# Patient Record
Sex: Male | Born: 1985 | Marital: Married | Smoking: Never smoker
Health system: Southern US, Community
[De-identification: ages and names within clinical notes are randomized; demographics above are authoritative.]

## PROBLEM LIST (undated history)

## (undated) DIAGNOSIS — J3081 Allergic rhinitis due to animal (cat) (dog) hair and dander: Secondary | ICD-10-CM

## (undated) DIAGNOSIS — T7840XA Allergy, unspecified, initial encounter: Secondary | ICD-10-CM

## (undated) DIAGNOSIS — J3089 Other allergic rhinitis: Secondary | ICD-10-CM

## (undated) DIAGNOSIS — J45909 Unspecified asthma, uncomplicated: Secondary | ICD-10-CM

## (undated) HISTORY — DX: Unspecified asthma, uncomplicated: J45.909

## (undated) HISTORY — DX: Other allergic rhinitis: J30.89

## (undated) HISTORY — DX: Allergic rhinitis due to animal (cat) (dog) hair and dander: J30.81

## (undated) HISTORY — DX: Allergy, unspecified, initial encounter: T78.40XA

---
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
## 2005-10-20 HISTORY — PX: NASAL SEPTUM SURGERY: SHX37

## 2008-05-22 ENCOUNTER — Ambulatory Visit: Payer: Self-pay | Admitting: Otolaryngology

## 2008-05-25 ENCOUNTER — Ambulatory Visit: Payer: Self-pay | Admitting: Otolaryngology

## 2015-11-29 ENCOUNTER — Ambulatory Visit (INDEPENDENT_AMBULATORY_CARE_PROVIDER_SITE_OTHER): Payer: 59 | Admitting: Family Medicine

## 2015-11-29 ENCOUNTER — Encounter: Payer: Self-pay | Admitting: Family Medicine

## 2015-11-29 VITALS — BP 115/61 | HR 76 | Temp 98.1°F | Resp 17 | Ht 70.0 in | Wt 173.5 lb

## 2015-11-29 DIAGNOSIS — S161XXA Strain of muscle, fascia and tendon at neck level, initial encounter: Secondary | ICD-10-CM | POA: Diagnosis not present

## 2015-11-29 DIAGNOSIS — Z7689 Persons encountering health services in other specified circumstances: Secondary | ICD-10-CM | POA: Insufficient documentation

## 2015-11-29 DIAGNOSIS — Z7189 Other specified counseling: Secondary | ICD-10-CM | POA: Diagnosis not present

## 2015-11-29 MED ORDER — TIZANIDINE HCL 4 MG PO TABS: 4.0000 mg | ORAL_TABLET | Freq: Two times a day (BID) | ORAL | Status: DC | PRN

## 2015-11-29 NOTE — Progress Notes (Signed)
Name: Alexander Mcpherson   MRN: [id number]    DOB: 06-12-1986   Date:11/29/2015       Progress Note  Subjective  Chief Complaint  Chief Complaint  Patient presents with  . Establish Care    NP    HPI  Pt. Is here to establish care. He is doing well.   He has noticed a 'knot' on the right side of neck for 6-8 months, sometimes feels a dull ache at the site of the knot. He also reports getting a headache when the neck muscle is very tight. No pain on rotation, flexion, or extension of the neck. No history of trauma to the affected area   Past Medical History  Diagnosis Date  . Allergy   . Asthma   . Allergic to cats   . Environmental and seasonal allergies     Past Surgical History  Procedure Laterality Date  . Wisdom tooth extraction Bilateral 2004  . Nasal septum surgery  2007    Family History  Problem Relation Age of Onset  . Healthy Mother   . Healthy Father     Social History   Social History  . Marital Status: Single    Spouse Name: N/A  . Number of Children: N/A  . Years of Education: N/A   Occupational History  . Not on file.   Social History Main Topics  . Smoking status: Never Smoker   . Smokeless tobacco: Never Used  . Alcohol Use: No  . Drug Use: No  . Sexual Activity:    Partners: Female   Other Topics Concern  . Not on file   Social History Narrative  . No narrative on file     Current outpatient prescriptions:  .  albuterol (PROVENTIL HFA;VENTOLIN HFA) 108 (90 Base) MCG/ACT inhaler, Inhale 1 puff into the lungs every 6 (six) hours as needed for wheezing or shortness of breath., Disp: , Rfl:  .  montelukast (SINGULAIR) 10 MG tablet, Take 10 mg by mouth at bedtime., Disp: , Rfl:   No Known Allergies   Review of Systems  Constitutional: Negative for fever and chills.  Respiratory: Negative for shortness of breath.   Cardiovascular: Negative for chest pain.  Gastrointestinal: Negative for abdominal pain.  Musculoskeletal:  Positive for neck pain.  Neurological: Positive for headaches.     Objective  Filed Vitals:   11/29/15 1003  BP: 115/61  Pulse: 76  Temp: 98.1 F (36.7 C)  TempSrc: Oral  Resp: 17  Height:  (1.778 m)  Weight: 173 lb 8 oz (78.699 kg)  SpO2: 98%    Physical Exam  Constitutional: He is well-developed, well-nourished, and in no distress.  Cardiovascular: Normal rate and regular rhythm.   Pulmonary/Chest: Effort normal and breath sounds normal.  Musculoskeletal:       Cervical back: He exhibits pain (pt. feel like muscle tightness) and spasm. He exhibits no tenderness.       Back:  Nursing note and vitals reviewed.    Assessment & Plan  1. Encounter to establish care with new doctor   2. Neck muscle strain, initial encounter We'll start on muscle relaxant, advised to apply heat as necessary to the area. Recheck in one month. - tiZANidine (ZANAFLEX) 4 MG tablet; Take 1 tablet (4 mg total) by mouth every 12 (twelve) hours as needed for muscle spasms.  Dispense: 10 tablet; Refill: 0   Eavan Gonterman Asad A[facility] 11/29/2015 10:45 AM

## 2016-01-11 ENCOUNTER — Ambulatory Visit (INDEPENDENT_AMBULATORY_CARE_PROVIDER_SITE_OTHER): Payer: 59 | Admitting: Family Medicine

## 2016-01-11 ENCOUNTER — Encounter: Payer: Self-pay | Admitting: Family Medicine

## 2016-01-11 VITALS — BP 118/68 | HR 68 | Temp 98.1°F | Resp 18 | Ht 70.0 in | Wt 177.1 lb

## 2016-01-11 DIAGNOSIS — Z0000 Encounter for general adult medical examination without abnormal findings: Secondary | ICD-10-CM

## 2016-01-11 NOTE — Progress Notes (Signed)
Name: NHAT HEARNE   MRN: [id number]    DOB: [birth year]   Date:01/11/2016       Progress Note  Subjective  Chief Complaint  Chief Complaint  Patient presents with  . Annual Exam    HPI  Pt. Is here for a Complete Physical Exam.  He is doing well.   Past Medical History  Diagnosis Date  . Allergy   . Asthma   . Allergic to cats   . Environmental and seasonal allergies     Past Surgical History  Procedure Laterality Date  . Wisdom tooth extraction Bilateral 2004  . Nasal septum surgery  2007    Family History  Problem Relation Age of Onset  . Healthy Mother   . Healthy Father     Social History   Social History  . Marital Status: Single    Spouse Name: N/A  . Number of Children: N/A  . Years of Education: N/A   Occupational History  . Not on file.   Social History Main Topics  . Smoking status: Never Smoker   . Smokeless tobacco: Never Used  . Alcohol Use: No  . Drug Use: No  . Sexual Activity:    Partners: Female   Other Topics Concern  . Not on file   Social History Narrative     Current outpatient prescriptions:  .  albuterol (PROVENTIL HFA;VENTOLIN HFA) 108 (90 Base) MCG/ACT inhaler, Inhale 1 puff into the lungs every 6 (six) hours as needed for wheezing or shortness of breath., Disp: , Rfl:  .  montelukast (SINGULAIR) 10 MG tablet, Take 10 mg by mouth at bedtime., Disp: , Rfl:  .  tiZANidine (ZANAFLEX) 4 MG tablet, Take 1 tablet (4 mg total) by mouth every 12 (twelve) hours as needed for muscle spasms., Disp: 10 tablet, Rfl: 0  No Known Allergies   Review of Systems  Constitutional: Positive for malaise/fatigue (feels exhausted some times.). Negative for fever and chills.  HENT: Positive for congestion and sore throat. Negative for ear pain.   Eyes: Negative for blurred vision and double vision.  Respiratory: Negative for cough and wheezing.   Cardiovascular: Negative for chest pain and palpitations.  Gastrointestinal: Negative  for heartburn, nausea, vomiting, abdominal pain, diarrhea, constipation, blood in stool and melena.  Genitourinary: Negative for dysuria, frequency and hematuria.  Musculoskeletal: Negative for myalgias, back pain and joint pain.  Neurological: Positive for headaches (Mostly sinus headaches.). Negative for dizziness.  Endo/Heme/Allergies: Positive for environmental allergies.  Psychiatric/Behavioral: Negative for depression. The patient is not nervous/anxious and does not have insomnia.      Objective  Filed Vitals:   01/11/16 0853  BP: 118/68  Pulse: 68  Temp: 98.1 F (36.7 C)  Resp: 18  Height:  (1.778 m)  Weight: 177 lb 2 oz (80.343 kg)  SpO2: 98%    Physical Exam  Constitutional: He is oriented to person, place, and time and well-developed, well-nourished, and in no distress.  HENT:  Head: Normocephalic and atraumatic.  Right Ear: Tympanic membrane and ear canal normal.  Left Ear: Tympanic membrane and ear canal normal.  Mouth/Throat: Posterior oropharyngeal erythema present.  Nasal mucosal inflammation, turbinate hypertrophy.  Eyes: Conjunctivae are normal. Pupils are equal, round, and reactive to light.  Cardiovascular: Normal rate and regular rhythm.   Pulmonary/Chest: Effort normal and breath sounds normal.  Abdominal: Soft. Bowel sounds are normal.  Genitourinary:  Deferred per patient preference  Musculoskeletal:       Right ankle: He  exhibits no swelling.       Left ankle: He exhibits no swelling.  Neurological: He is alert and oriented to person, place, and time.  Skin: Skin is warm.  Psychiatric: Mood, memory, affect and judgment normal.  Nursing note and vitals reviewed.    Assessment & Plan  1. Annual physical exam Age appropriate laboratory screenings obtained. - CBC with Differential - Lipid Profile - Comprehensive Metabolic Panel (CMET) - Vitamin D (25 hydroxy)   Mallie Linnemann Asad [facility]. [facility] 01/11/2016 9:03 AM

## 2016-01-12 LAB — CBC WITH DIFFERENTIAL/PLATELET
BASOS ABS: 0 10*3/uL (ref 0.0–0.2)
Basos: 1 %
EOS (ABSOLUTE): 0.2 10*3/uL (ref 0.0–0.4)
Eos: 3 %
HEMOGLOBIN: 15.3 g/dL (ref 12.6–17.7)
Hematocrit: 44 % (ref 37.5–51.0)
IMMATURE GRANS (ABS): 0 10*3/uL (ref 0.0–0.1)
Immature Granulocytes: 0 %
LYMPHS: 26 %
Lymphocytes Absolute: 1.6 10*3/uL (ref 0.7–3.1)
MCH: 29.3 pg (ref 26.6–33.0)
MCHC: 34.8 g/dL (ref 31.5–35.7)
MCV: 84 fL (ref 79–97)
MONOCYTES: 10 %
Monocytes Absolute: 0.6 10*3/uL (ref 0.1–0.9)
Neutrophils Absolute: 3.6 10*3/uL (ref 1.4–7.0)
Neutrophils: 60 %
Platelets: 213 10*3/uL (ref 150–379)
RBC: 5.22 x10E6/uL (ref 4.14–5.80)
RDW: 13.3 % (ref 12.3–15.4)
WBC: 6 10*3/uL (ref 3.4–10.8)

## 2016-01-12 LAB — COMPREHENSIVE METABOLIC PANEL
A/G RATIO: 1.7 (ref 1.2–2.2)
ALK PHOS: 71 IU/L (ref 39–117)
ALT: 14 IU/L (ref 0–44)
AST: 18 IU/L (ref 0–40)
Albumin: 4.7 g/dL (ref 3.5–5.5)
BUN / CREAT RATIO: 11 (ref 8–19)
BUN: 12 mg/dL (ref 6–20)
Bilirubin Total: 0.6 mg/dL (ref 0.0–1.2)
CALCIUM: 10 mg/dL (ref 8.7–10.2)
CO2: 26 mmol/L (ref 18–29)
Chloride: 103 mmol/L (ref 96–106)
Creatinine, Ser: 1.13 mg/dL (ref 0.76–1.27)
GFR calc Af Amer: 101 mL/min/{1.73_m2} (ref >59)
GFR, EST NON AFRICAN AMERICAN: 87 mL/min/{1.73_m2} (ref >59)
GLOBULIN, TOTAL: 2.8 g/dL (ref 1.5–4.5)
Glucose: 97 mg/dL (ref 65–99)
POTASSIUM: 5.8 mmol/L — AB (ref 3.5–5.2)
SODIUM: 144 mmol/L (ref 134–144)
Total Protein: 7.5 g/dL (ref 6.0–8.5)

## 2016-01-12 LAB — LIPID PANEL
CHOLESTEROL TOTAL: 130 mg/dL (ref 100–199)
Chol/HDL Ratio: 2.5 ratio units (ref 0.0–5.0)
HDL: 53 mg/dL (ref >39)
LDL CALC: 67 mg/dL (ref 0–99)
TRIGLYCERIDES: 52 mg/dL (ref 0–149)
VLDL Cholesterol Cal: 10 mg/dL (ref 5–40)

## 2016-01-12 LAB — VITAMIN D 25 HYDROXY (VIT D DEFICIENCY, FRACTURES): Vit D, 25-Hydroxy: 19.3 ng/mL — ABNORMAL LOW (ref 30.0–100.0)

## 2016-01-17 ENCOUNTER — Telehealth: Payer: Self-pay

## 2016-01-17 MED ORDER — VITAMIN D (ERGOCALCIFEROL) 1.25 MG (50000 UNIT) PO CAPS: 50000.0000 [IU] | ORAL_CAPSULE | ORAL | Status: DC

## 2016-01-17 NOTE — Telephone Encounter (Signed)
Lab results have been reported to patient and a prescription for Vitamin D3 50,000 units has [facility] per Dr. Sherryll BurgerShah and patient is to take 1 capsule once a week for 12 weeks, patient has been notified

## 2016-09-08 DIAGNOSIS — H5211 Myopia, right eye: Secondary | ICD-10-CM | POA: Diagnosis not present

## 2016-09-08 DIAGNOSIS — H52223 Regular astigmatism, bilateral: Secondary | ICD-10-CM | POA: Diagnosis not present

## 2017-06-09 ENCOUNTER — Encounter: Payer: Self-pay | Admitting: Family Medicine

## 2017-06-09 ENCOUNTER — Ambulatory Visit (INDEPENDENT_AMBULATORY_CARE_PROVIDER_SITE_OTHER): Payer: 59 | Admitting: Family Medicine

## 2017-06-09 VITALS — BP 104/76 | HR 75 | Temp 98.1°F | Resp 16 | Ht 70.0 in | Wt 172.1 lb

## 2017-06-09 DIAGNOSIS — Z0000 Encounter for general adult medical examination without abnormal findings: Secondary | ICD-10-CM

## 2017-06-09 DIAGNOSIS — J341 Cyst and mucocele of nose and nasal sinus: Secondary | ICD-10-CM | POA: Diagnosis not present

## 2017-06-09 DIAGNOSIS — J310 Chronic rhinitis: Secondary | ICD-10-CM | POA: Diagnosis not present

## 2017-06-09 NOTE — Progress Notes (Signed)
Name: Alexander Mcpherson   MRN: [id number]    DOB: October 27, 1985   Date:06/09/2017       Progress Note  Subjective  Chief Complaint  Chief Complaint  Patient presents with  . Annual Exam    HPI  Pt presents for annual examination and physical  - Pt notes that he was at the dentist about 2 years ago and was told he had a cyst in his LEFT maxillary sinus cavity after having Xrays done, would like referral to ENT for further evaluation. He has chronic rhinorrhea with clear drainage.  USPSTF grade A and B recommendations  Diet: Eats a well balanced diet; recently did the keto diet for 30 days. Exercise: Five days a week - usually goes to the gym and works out for about 1.5 hours - cardio and weight.  Also plays basketball in fall and spring.  Depression:  Depression screen [facility] 2/9 06/09/2017 01/11/2016 11/29/2015  Decreased Interest 0 0 0  Down, Depressed, Hopeless 0 0 0  PHQ - 2 Score 0 0 0   Hypertension: BP Readings from Last 3 Encounters:  06/09/17 104/76  01/11/16 118/68  11/29/15 115/61   Obesity: Wt Readings from Last 3 Encounters:  06/09/17 172 lb 1.6 oz (78.1 kg)  01/11/16 177 lb 2 oz (80.3 kg)  11/29/15 173 lb 8 oz (78.7 kg)   BMI Readings from Last 3 Encounters:  06/09/17 24.69 kg/m  01/11/16 25.41 kg/m  11/29/15 24.89 kg/m    Alcohol: None Tobacco use: Never user  HIV, hep B, hep C: Will defer HIV screen until next year; no accidental needle sticks etc. That would put pt at risk for Hep B/C exposure Married STD testing and prevention (chl/gon/syphilis): Declines Lipids:  Lab Results  Component Value Date   CHOL 130 01/11/2016   Lab Results  Component Value Date   HDL 53 01/11/2016   Lab Results  Component Value Date   LDLCALC 67 01/11/2016   Lab Results  Component Value Date   TRIG 52 01/11/2016   Lab Results  Component Value Date   CHOLHDL 2.5 01/11/2016   No results found for: LDLDIRECT Glucose:  Glucose  Date Value Ref Range Status   01/11/2016 97 65 - 99 mg/dL Final   Colorectal cancer: No family history of colorectal cancer; no need to screen early Prostate cancer: No family history of prostate cancer; no need to screen early No results found for: PSA Skin cancer: No concerning lesions that pt has noticed; Doesn't use sunscreen every time. ECG: No chest pain, shortness of breath, or palpitations. Advanced Care Planning: A voluntary discussion about advance care planning including the explanation and discussion of advance directives was extensively discussed with the patient. Explanation about the health care proxy and Living will was reviewed. During this discussion, the patient was able to identify a health care proxy as Jeet Shough.  Patient Active Problem List   Diagnosis Date Noted  . Annual physical exam 01/11/2016  . Encounter to establish care with new doctor 11/29/2015    Past Surgical History:  Procedure Laterality Date  . NASAL SEPTUM SURGERY  2007  . WISDOM TOOTH EXTRACTION Bilateral 2004    Family History  Problem Relation Age of Onset  . Healthy Mother   . Healthy Father     Social History   Social History  . Marital status: Single    Spouse name: N/A  . Number of children: N/A  . Years of education: N/A   Occupational History  .  Not on file.   Social History Main Topics  . Smoking status: Never Smoker  . Smokeless tobacco: Never Used  . Alcohol use No  . Drug use: No  . Sexual activity: Yes    Partners: Female   Other Topics Concern  . Not on file   Social History Narrative  . No narrative on file    Current Outpatient Prescriptions:  .  albuterol (PROVENTIL HFA;VENTOLIN HFA) 108 (90 Base) MCG/ACT inhaler, Inhale 1 puff into the lungs every 6 (six) hours as needed for wheezing or shortness of breath., Disp: , Rfl:  .  montelukast (SINGULAIR) 10 MG tablet, Take 10 mg by mouth at bedtime., Disp: , Rfl:  .  Vitamin D, Ergocalciferol, (DRISDOL) 50000 units CAPS capsule,  Take 1 capsule (50,000 Units total) by mouth once a week. For 12 weeks, Disp: 12 capsule, Rfl: 0  No Known Allergies  ROS  Constitutional: Negative for fever or weight change.  Respiratory: Negative for cough and shortness of breath.   Cardiovascular: Negative for chest pain or palpitations.  Gastrointestinal: Negative for abdominal pain, no bowel changes.  Musculoskeletal: Negative for gait problem or joint swelling.  Skin: Negative for rash.  Neurological: Negative for dizziness; Positive for headaches - describes as coming up from shoulder/neck muscles and into the posterior head; also describes occasional sinus headaches.  No other specific complaints in a complete review of systems (except as listed in HPI above).  Objective  Vitals:   06/09/17 0826  BP: 104/76  Pulse: 75  Resp: 16  Temp: 98.1 F (36.7 C)  TempSrc: Oral  SpO2: 96%  Weight: 172 lb 1.6 oz (78.1 kg)  Height: 5\' 10"  (1.778 m)    Body mass index is 24.69 kg/m.  Physical Exam Constitutional: Patient appears well-developed and well-nourished. No distress.  HENT: Head: Normocephalic and atraumatic. Ears: B TMs ok, no erythema or effusion; Nose: Nose normal. Mouth/Throat: Oropharynx is clear and moist. No oropharyngeal exudate.  Eyes: Conjunctivae and EOM are normal. Pupils are equal, round, and reactive to light. No scleral icterus.  Neck: Normal range of motion. Neck supple. No JVD present. No thyromegaly present.  Cardiovascular: Normal rate, regular rhythm and normal heart sounds.  No murmur heard. No BLE edema. Pulmonary/Chest: Effort normal and breath sounds normal. No respiratory distress. Abdominal: Soft. Bowel sounds are normal, no distension. There is no tenderness. no masses Musculoskeletal: Normal range of motion, no joint effusions. No gross deformities Neurological: he is alert and oriented to person, place, and time. No cranial nerve deficit. Coordination, balance, strength, speech and gait are  normal.  Skin: Skin is warm and dry. No rash noted. No erythema.  Psychiatric: Patient has a normal mood and affect. behavior is normal. Judgment and thought content normal.  No results found for this or any previous visit (from the past 2160 hour(s)).  PHQ2/9: Depression screen [facility] 2/9 06/09/2017 01/11/2016 11/29/2015  Decreased Interest 0 0 0  Down, Depressed, Hopeless 0 0 0  PHQ - 2 Score 0 0 0   Fall Risk: Fall Risk  06/09/2017 01/11/2016 11/29/2015  Falls in the past year? No No No   Assessment & Plan  1. Annual physical exam Discussed importance of 150 minutes of physical activity weekly, eat two servings of fish weekly, eat one serving of tree nuts ( cashews, pistachios, pecans, almonds.Marland Kitchen) every other day, eat 6 servings of fruit/vegetables daily and drink plenty of water and avoid sweet beverages.   2. Chronic rhinitis - Ambulatory referral  to ENT  3. Maxillary sinus cyst - Ambulatory referral to ENT  -USPSTF grade A and B recommendations reviewed with patient; age-appropriate recommendations, preventive care, screening tests, etc discussed and encouraged; healthy living encouraged; see AVS for patient education given to patient

## 2017-06-09 NOTE — Patient Instructions (Addendum)
Preventive Care 18-39 Years, Male Preventive care refers to lifestyle choices and visits with your health care provider that can promote health and wellness. What does preventive care include?  A yearly physical exam. This is also called an annual well check.  Dental exams once or twice a year.  Routine eye exams. Ask your health care provider how often you should have your eyes checked.  Personal lifestyle choices, including: ? Daily care of your teeth and gums. ? Regular physical activity. ? Eating a healthy diet. ? Avoiding tobacco and drug use. ? Limiting alcohol use. ? Practicing safe sex. What happens during an annual well check? The services and screenings done by your health care provider during your annual well check will depend on your age, overall health, lifestyle risk factors, and family history of disease. Counseling Your health care provider may ask you questions about your:  Alcohol use.  Tobacco use.  Drug use.  Emotional well-being.  Home and relationship well-being.  Sexual activity.  Eating habits.  Work and work environment.  Screening You may have the following tests or measurements:  Height, weight, and BMI.  Blood pressure.  Lipid and cholesterol levels. These may be checked every 5 years starting at age 20.  Diabetes screening. This is done by checking your blood sugar (glucose) after you have not eaten for a while (fasting).  Skin check.  Hepatitis C blood test.  Hepatitis B blood test.  Sexually transmitted disease (STD) testing.  Discuss your test results, treatment options, and if necessary, the need for more tests with your health care provider. Vaccines Your health care provider may recommend certain vaccines, such as:  Influenza vaccine. This is recommended every year.  Tetanus, diphtheria, and acellular pertussis (Tdap, Td) vaccine. You may need a Td booster every 10 years.  Varicella vaccine. You may need this if you  have not been vaccinated.  HPV vaccine. If you are 26 or younger, you may need three doses over 6 months.  Measles, mumps, and rubella (MMR) vaccine. You may need at least one dose of MMR.You may also need a second dose.  Pneumococcal 13-valent conjugate (PCV13) vaccine. You may need this if you have certain conditions and have not been vaccinated.  Pneumococcal polysaccharide (PPSV23) vaccine. You may need one or two doses if you smoke cigarettes or if you have certain conditions.  Meningococcal vaccine. One dose is recommended if you are age 19-21 years and a first-year college student living in a residence hall, or if you have one of several medical conditions. You may also need additional booster doses.  Hepatitis A vaccine. You may need this if you have certain conditions or if you travel or work in places where you may be exposed to hepatitis A.  Hepatitis B vaccine. You may need this if you have certain conditions or if you travel or work in places where you may be exposed to hepatitis B.  Haemophilus influenzae type b (Hib) vaccine. You may need this if you have certain risk factors.  Talk to your health care provider about which screenings and vaccines you need and how often you need them. This information is not intended to replace advice given to you by your health care provider. Make sure you discuss any questions you have with your health care provider. Document Released: 12/02/2001 Document Revised: 06/25/2016 Document Reviewed: 08/07/2015 Elsevier Interactive Patient Education  2017 Elsevier Inc.  

## 2017-06-24 DIAGNOSIS — J329 Chronic sinusitis, unspecified: Secondary | ICD-10-CM | POA: Diagnosis not present

## 2017-06-24 DIAGNOSIS — J310 Chronic rhinitis: Secondary | ICD-10-CM | POA: Diagnosis not present

## 2017-09-01 DIAGNOSIS — H52223 Regular astigmatism, bilateral: Secondary | ICD-10-CM | POA: Diagnosis not present

## 2017-09-01 DIAGNOSIS — H5213 Myopia, bilateral: Secondary | ICD-10-CM | POA: Diagnosis not present

## 2017-11-27 ENCOUNTER — Other Ambulatory Visit: Payer: Self-pay | Admitting: Family Medicine

## 2017-11-27 ENCOUNTER — Ambulatory Visit
Admission: RE | Admit: 2017-11-27 | Discharge: 2017-11-27 | Disposition: A | Discharge status: Home / Self Care | Payer: 59 | Source: Ambulatory Visit | Attending: Family Medicine | Admitting: Family Medicine

## 2017-11-27 DIAGNOSIS — T1490XA Injury, unspecified, initial encounter: Secondary | ICD-10-CM

## 2017-11-27 DIAGNOSIS — M25532 Pain in left wrist: Secondary | ICD-10-CM | POA: Insufficient documentation

## 2017-11-27 DIAGNOSIS — S6992XA Unspecified injury of left wrist, hand and finger(s), initial encounter: Secondary | ICD-10-CM | POA: Diagnosis not present

## 2017-11-27 DIAGNOSIS — M79642 Pain in left hand: Secondary | ICD-10-CM | POA: Diagnosis not present

## 2018-06-23 ENCOUNTER — Encounter: Payer: Self-pay | Admitting: Family Medicine

## 2018-07-09 ENCOUNTER — Encounter: Payer: Self-pay | Admitting: Family Medicine

## 2018-07-09 ENCOUNTER — Other Ambulatory Visit (HOSPITAL_COMMUNITY)
Admission: RE | Admit: 2018-07-09 | Discharge: 2018-07-09 | Disposition: A | Discharge status: Home / Self Care | Payer: 59 | Source: Ambulatory Visit | Attending: Family Medicine | Admitting: Family Medicine

## 2018-07-09 ENCOUNTER — Ambulatory Visit (INDEPENDENT_AMBULATORY_CARE_PROVIDER_SITE_OTHER): Payer: 59 | Admitting: Family Medicine

## 2018-07-09 VITALS — BP 110/68 | HR 83 | Temp 98.1°F | Resp 16 | Ht 70.0 in | Wt 166.7 lb

## 2018-07-09 DIAGNOSIS — E559 Vitamin D deficiency, unspecified: Secondary | ICD-10-CM

## 2018-07-09 DIAGNOSIS — J4520 Mild intermittent asthma, uncomplicated: Secondary | ICD-10-CM | POA: Diagnosis not present

## 2018-07-09 DIAGNOSIS — Z13220 Encounter for screening for lipoid disorders: Secondary | ICD-10-CM | POA: Diagnosis not present

## 2018-07-09 DIAGNOSIS — Z833 Family history of diabetes mellitus: Secondary | ICD-10-CM

## 2018-07-09 DIAGNOSIS — Z113 Encounter for screening for infections with a predominantly sexual mode of transmission: Secondary | ICD-10-CM | POA: Diagnosis not present

## 2018-07-09 DIAGNOSIS — Z1159 Encounter for screening for other viral diseases: Secondary | ICD-10-CM

## 2018-07-09 DIAGNOSIS — R5383 Other fatigue: Secondary | ICD-10-CM | POA: Diagnosis not present

## 2018-07-09 DIAGNOSIS — Z0000 Encounter for general adult medical examination without abnormal findings: Secondary | ICD-10-CM

## 2018-07-09 MED ORDER — ALBUTEROL SULFATE 108 (90 BASE) MCG/ACT IN AEPB: 1.0000 | INHALATION_SPRAY | RESPIRATORY_TRACT | 3 refills | Status: DC | PRN

## 2018-07-09 NOTE — Patient Instructions (Signed)
Preventive Care 18-39 Years, Male Preventive care refers to lifestyle choices and visits with your health care provider that can promote health and wellness. What does preventive care include?  A yearly physical exam. This is also called an annual well check.  Dental exams once or twice a year.  Routine eye exams. Ask your health care provider how often you should have your eyes checked.  Personal lifestyle choices, including: ? Daily care of your teeth and gums. ? Regular physical activity. ? Eating a healthy diet. ? Avoiding tobacco and drug use. ? Limiting alcohol use. ? Practicing safe sex. What happens during an annual well check? The services and screenings done by your health care provider during your annual well check will depend on your age, overall health, lifestyle risk factors, and family history of disease. Counseling Your health care provider may ask you questions about your:  Alcohol use.  Tobacco use.  Drug use.  Emotional well-being.  Home and relationship well-being.  Sexual activity.  Eating habits.  Work and work Statistician.  Screening You may have the following tests or measurements:  Height, weight, and BMI.  Blood pressure.  Lipid and cholesterol levels. These may be checked every 5 years starting at age 34.  Diabetes screening. This is done by checking your blood sugar (glucose) after you have not eaten for a while (fasting).  Skin check.  Hepatitis C blood test.  Hepatitis B blood test.  Sexually transmitted disease (STD) testing.  Discuss your test results, treatment options, and if necessary, the need for more tests with your health care provider. Vaccines Your health care provider may recommend certain vaccines, such as:  Influenza vaccine. This is recommended every year.  Tetanus, diphtheria, and acellular pertussis (Tdap, Td) vaccine. You may need a Td booster every 10 years.  Varicella vaccine. You may need this if you  have not been vaccinated.  HPV vaccine. If you are 23 or younger, you may need three doses over 6 months.  Measles, mumps, and rubella (MMR) vaccine. You may need at least one dose of MMR.You may also need a second dose.  Pneumococcal 13-valent conjugate (PCV13) vaccine. You may need this if you have certain conditions and have not been vaccinated.  Pneumococcal polysaccharide (PPSV23) vaccine. You may need one or two doses if you smoke cigarettes or if you have certain conditions.  Meningococcal vaccine. One dose is recommended if you are age 65-21 years and a first-year college student living in a residence hall, or if you have one of several medical conditions. You may also need additional booster doses.  Hepatitis A vaccine. You may need this if you have certain conditions or if you travel or work in places where you may be exposed to hepatitis A.  Hepatitis B vaccine. You may need this if you have certain conditions or if you travel or work in places where you may be exposed to hepatitis B.  Haemophilus influenzae type b (Hib) vaccine. You may need this if you have certain risk factors.  Talk to your health care provider about which screenings and vaccines you need and how often you need them. This information is not intended to replace advice given to you by your health care provider. Make sure you discuss any questions you have with your health care provider. Document Released: 12/02/2001 Document Revised: 06/25/2016 Document Reviewed: 08/07/2015 Elsevier Interactive Patient Education  [employer].

## 2018-07-09 NOTE — Progress Notes (Signed)
Name: Alexander Mcpherson   MRN: [id number]    DOB: 24-Feb-1986   Date:07/09/2018       Progress Note  Subjective  Chief Complaint  Chief Complaint  Patient presents with  . Annual Exam    HPI  Patient presents for annual CPE.  He does need a refill of his albuterol inhaler; he uses this when he gets a cold, and his last one is expired.  Had asthma growing up, now it only bothers him when he is sick.  USPSTF grade A and B recommendations:  Diet: Well balanced - intermittent fasting for breakfast.  Doesn't limit his intake, but tries to eat healthy options.  Eats out about 30% of the time. Exercise: 4 days a week - weights; 3 days a week plays basketball.  Depression:  Depression screen [facility] 2/9 07/09/2018 06/09/2017 01/11/2016 11/29/2015  Decreased Interest 0 0 0 0  Down, Depressed, Hopeless 0 0 0 0  PHQ - 2 Score 0 0 0 0  Altered sleeping 0 - - -  Tired, decreased energy 0 - - -  Change in appetite 0 - - -  Feeling bad or failure about yourself  0 - - -  Trouble concentrating 0 - - -  Suicidal thoughts 0 - - -  PHQ-9 Score 0 - - -  Difficult doing work/chores Not difficult at all - - -    Hypertension:  BP Readings from Last 3 Encounters:  07/09/18 110/68  06/09/17 104/76  01/11/16 118/68    Obesity: Wt Readings from Last 3 Encounters:  07/09/18 166 lb 11.2 oz (75.6 kg)  06/09/17 172 lb 1.6 oz (78.1 kg)  01/11/16 177 lb 2 oz (80.3 kg)   BMI Readings from Last 3 Encounters:  07/09/18 23.92 kg/m  06/09/17 24.69 kg/m  01/11/16 25.41 kg/m    Lipids:  Lab Results  Component Value Date   CHOL 130 01/11/2016   Lab Results  Component Value Date   HDL 53 01/11/2016   Lab Results  Component Value Date   LDLCALC 67 01/11/2016   Lab Results  Component Value Date   TRIG 52 01/11/2016   Lab Results  Component Value Date   CHOLHDL 2.5 01/11/2016   No results found for: LDLDIRECT Glucose:  Glucose  Date Value Ref Range Status  01/11/2016 97 65 - 99 mg/dL  Final      Office Visit from 07/09/2018 in [facility]  AUDIT-C Score  0     Married STD testing and prevention (HIV/chl/gon/syphilis): We will check today Hep C: We will check today  Skin cancer: No concerning lesions; discussed sunscreen use Colorectal cancer: No family history of colorectal cancer - Denies blood in stool, dark and tarry stool, diarrhea/constipation Prostate cancer: Denies family history prostate cancer.  ECG: Denies chest pain or palpitations; not indicated today  Advanced Care Planning: A voluntary discussion about advance care planning including the explanation and discussion of advance directives.  Discussed health care proxy and Living will, and the patient was able to identify a health care proxy as Alexander Mcpherson.  Patient does not have a living will at present time. If patient does have living will, I have requested they bring this to the clinic to be scanned in to their chart.  Patient Active Problem List   Diagnosis Date Noted  . Annual physical exam 01/11/2016  . Encounter to establish care with new doctor 11/29/2015    Past Surgical History:  Procedure Laterality Date  .  NASAL SEPTUM SURGERY  2007  . WISDOM TOOTH EXTRACTION Bilateral 2004    Family History  Problem Relation Age of Onset  . Diabetes type II Mother   . Healthy Father   . Diabetes type II Maternal Grandmother   . Heart attack Maternal Grandmother     Social History   Socioeconomic History  . Marital status: Married    Spouse name: Not on file  . Number of children: Not on file  . Years of education: Not on file  . Highest education level: Not on file  Occupational History  . Occupation: Pharmacist, communityractice Manager    Employer: [employer]    Comment: Works at [employer] in the [employer]  Social Needs  . Financial resource strain: Not hard at all  . Food insecurity:    Worry: Never true    Inability: Never true  . Transportation needs:    Medical: No     Non-medical: No  Tobacco Use  . Smoking status: Never Smoker  . Smokeless tobacco: Never Used  Substance and Sexual Activity  . Alcohol use: No    Alcohol/week: 0.0 standard drinks  . Drug use: No  . Sexual activity: Yes    Partners: Female  Lifestyle  . Physical activity:    Days per week: 7 days    Minutes per session: 60 min  . Stress: Not at all  Relationships  . Social connections:    Talks on phone: Three times a week    Gets together: Twice a week    Attends religious service: More than 4 times per year    Active member of club or organization: Yes    Attends meetings of clubs or organizations: More than 4 times per year    Relationship status: Married  . Intimate partner violence:    Fear of current or ex partner: No    Emotionally abused: No    Physically abused: No    Forced sexual activity: No  Other Topics Concern  . Not on file  Social History Narrative   Married to wife - Alexander HaltMorgan     Current Outpatient Medications:  .  Albuterol Sulfate (PROAIR RESPICLICK) 108 (90 Base) MCG/ACT AEPB, Inhale 1-2 puffs into the lungs every 4 (four) hours as needed (Wheezing/coughing)., Disp: 1 each, Rfl: 3 .  Vitamin D, Ergocalciferol, (DRISDOL) 50000 units CAPS capsule, Take 1 capsule (50,000 Units total) by mouth once a week. For 12 weeks, Disp: 12 capsule, Rfl: 0  No Known Allergies   ROS  Constitutional: Negative for fever or weight change. Does endorse intermittent fatigue.  Respiratory: Positive for cough and some shortness of breath - notes he is getting over a cold  Cardiovascular: Negative for chest pain or palpitations.  Gastrointestinal: Negative for abdominal pain, no bowel changes.  Musculoskeletal: Negative for gait problem or joint swelling.  Skin: Negative for rash.  Neurological: Negative for dizziness or headache.  No other specific complaints in a complete review of systems (except as listed in HPI above).  Objective  Vitals:   07/09/18 0755   BP: 110/68  Pulse: 83  Resp: 16  Temp: 98.1 F (36.7 C)  TempSrc: Oral  SpO2: 99%  Weight: 166 lb 11.2 oz (75.6 kg)  Height: 5\' 10"  (1.778 m)    Body mass index is 23.92 kg/m.  Physical Exam Constitutional: Patient appears well-developed and well-nourished. No distress.  HENT: Head: Normocephalic and atraumatic. Ears: B TMs ok, no erythema or effusion; Nose: Nose normal.  Mouth/Throat: Oropharynx is clear and moist. No oropharyngeal exudate.  Eyes: Conjunctivae and EOM are normal. Pupils are equal, round, and reactive to light. No scleral icterus.  Neck: Normal range of motion. Neck supple. No JVD present. No thyromegaly present.  Cardiovascular: Normal rate, regular rhythm and normal heart sounds.  No murmur heard. No BLE edema. Pulmonary/Chest: Effort normal and breath sounds normal. No respiratory distress. Abdominal: Soft. Bowel sounds are normal, no distension. There is no tenderness. no masses MALE GENITALIA: Deferred RECTAL: Deferred Musculoskeletal: Normal range of motion, no joint effusions. No gross deformities Neurological: he is alert and oriented to person, place, and time. No cranial nerve deficit. Coordination, balance, strength, speech and gait are normal.  Skin: Skin is warm and dry. No rash noted. No erythema.  Psychiatric: Patient has a normal mood and affect. behavior is normal. Judgment and thought content normal.  No results found for this or any previous visit (from the past 2160 hour(s)).   PHQ2/9: Depression screen [facility] 2/9 07/09/2018 06/09/2017 01/11/2016 11/29/2015  Decreased Interest 0 0 0 0  Down, Depressed, Hopeless 0 0 0 0  PHQ - 2 Score 0 0 0 0  Altered sleeping 0 - - -  Tired, decreased energy 0 - - -  Change in appetite 0 - - -  Feeling bad or failure about yourself  0 - - -  Trouble concentrating 0 - - -  Suicidal thoughts 0 - - -  PHQ-9 Score 0 - - -  Difficult doing work/chores Not difficult at all - - -   Fall Risk: Fall Risk  07/09/2018  06/09/2017 01/11/2016 11/29/2015  Falls in the past year? No No No No   Assessment & Plan  1. Annual physical exam -Prostate cancer screening and PSA options (with potential risks and benefits of testing vs not testing) were discussed along with recent recs/guidelines. -USPSTF grade A and B recommendations reviewed with patient; age-appropriate recommendations, preventive care, screening tests, etc discussed and encouraged; healthy living encouraged; see AVS for patient education given to patient -Discussed importance of 150 minutes of physical activity weekly, eat two servings of fish weekly, eat one serving of tree nuts ( cashews, pistachios, pecans, almonds.Marland Kitchen) every other day, eat 6 servings of fruit/vegetables daily and drink plenty of water and avoid sweet beverages.   2. Mild intermittent asthma without complication - Stable - Albuterol Sulfate (PROAIR RESPICLICK) 108 (90 Base) MCG/ACT AEPB; Inhale 1-2 puffs into the lungs every 4 (four) hours as needed (Wheezing/coughing).  Dispense: 1 each; Refill: 3  3. Family history of diabetes mellitus - COMPLETE METABOLIC PANEL WITH GFR  4. Lipid screening - Lipid panel  5. Routine screening for STI (sexually transmitted infection) - HIV Antibody (routine testing w rflx) - RPR - Hepatitis C antibody - Cytology (oral, anal, urethral) ancillary only  6. Need for hepatitis C screening test - Hepatitis C antibody  7. Vitamin D deficiency - Vitamin D (25 hydroxy)  8. Fatigue, unspecified type - COMPLETE METABOLIC PANEL WITH GFR - Vitamin D (25 hydroxy) - CBC w/Diff/Platelet - TSH  Vaccinations are performed through [facility] - we will work on obtaining records to update his HM.

## 2018-07-12 LAB — COMPLETE METABOLIC PANEL WITH GFR
AG Ratio: 1.7 (calc) (ref 1.0–2.5)
ALBUMIN MSPROF: 4.5 g/dL (ref 3.6–5.1)
ALT: 12 U/L (ref 9–46)
AST: 14 U/L (ref 10–40)
Alkaline phosphatase (APISO): 59 U/L (ref 40–115)
BUN: 16 mg/dL (ref 7–25)
CALCIUM: 9.5 mg/dL (ref 8.6–10.3)
CO2: 28 mmol/L (ref 20–32)
CREATININE: 1.05 mg/dL (ref 0.60–1.35)
Chloride: 103 mmol/L (ref 98–110)
GFR, EST AFRICAN AMERICAN: 109 mL/min/{1.73_m2} (ref >=60)
GFR, EST NON AFRICAN AMERICAN: 94 mL/min/{1.73_m2} (ref >=60)
GLUCOSE: 87 mg/dL (ref 65–99)
Globulin: 2.7 g/dL (calc) (ref 1.9–3.7)
Potassium: 4.4 mmol/L (ref 3.5–5.3)
Sodium: 140 mmol/L (ref 135–146)
Total Bilirubin: 0.5 mg/dL (ref 0.2–1.2)
Total Protein: 7.2 g/dL (ref 6.1–8.1)

## 2018-07-12 LAB — CBC WITH DIFFERENTIAL/PLATELET
Basophils Absolute: 61 cells/uL (ref 0–200)
Basophils Relative: 1 %
EOS PCT: 2 %
Eosinophils Absolute: 122 cells/uL (ref 15–500)
HEMATOCRIT: 44.9 % (ref 38.5–50.0)
HEMOGLOBIN: 14.9 g/dL (ref 13.2–17.1)
LYMPHS ABS: 1574 {cells}/uL (ref 850–3900)
MCH: 28.7 pg (ref 27.0–33.0)
MCHC: 33.2 g/dL (ref 32.0–36.0)
MCV: 86.3 fL (ref 80.0–100.0)
MONOS PCT: 6.2 %
MPV: 11 fL (ref 7.5–12.5)
NEUTROS ABS: 3965 {cells}/uL (ref 1500–7800)
Neutrophils Relative %: 65 %
Platelets: 237 10*3/uL (ref 140–400)
RBC: 5.2 10*6/uL (ref 4.20–5.80)
RDW: 12.7 % (ref 11.0–15.0)
Total Lymphocyte: 25.8 %
WBC mixed population: 378 cells/uL (ref 200–950)
WBC: 6.1 10*3/uL (ref 3.8–10.8)

## 2018-07-12 LAB — LIPID PANEL
CHOL/HDL RATIO: 2.5 (calc) (ref <5.0)
CHOLESTEROL: 118 mg/dL (ref <200)
HDL: 47 mg/dL (ref >40)
LDL CHOLESTEROL (CALC): 59 mg/dL
Non-HDL Cholesterol (Calc): 71 mg/dL (calc) (ref <130)
Triglycerides: 42 mg/dL (ref <150)

## 2018-07-12 LAB — TSH: TSH: 1.23 m[IU]/L (ref 0.40–4.50)

## 2018-07-12 LAB — HEPATITIS C ANTIBODY
Hepatitis C Ab: NONREACTIVE
SIGNAL TO CUT-OFF: 0.06 (ref <1.00)

## 2018-07-12 LAB — CYTOLOGY, (ORAL, ANAL, URETHRAL) ANCILLARY ONLY
Chlamydia: NEGATIVE
NEISSERIA GONORRHEA: NEGATIVE

## 2018-07-12 LAB — RPR: RPR Ser Ql: NONREACTIVE

## 2018-07-12 LAB — VITAMIN D 25 HYDROXY (VIT D DEFICIENCY, FRACTURES): VIT D 25 HYDROXY: 50 ng/mL (ref 30–100)

## 2018-07-12 LAB — HIV ANTIBODY (ROUTINE TESTING W REFLEX): HIV 1&2 Ab, 4th Generation: NONREACTIVE

## 2018-08-26 DIAGNOSIS — H5213 Myopia, bilateral: Secondary | ICD-10-CM | POA: Diagnosis not present

## 2018-08-26 DIAGNOSIS — H52223 Regular astigmatism, bilateral: Secondary | ICD-10-CM | POA: Diagnosis not present

## 2019-04-15 IMAGING — CR DG HAND COMPLETE 3+V*L*
1 series · 3 of 3 positions shown · non-contrast
Comparison: None.

CLINICAL DATA: Pain following fall

EXAM:
LEFT HAND - COMPLETE 3+ VIEW

[Series 1: dg hand complete left · 0.14mm/px · 3 of 3 slices shown]
[im 1/3]
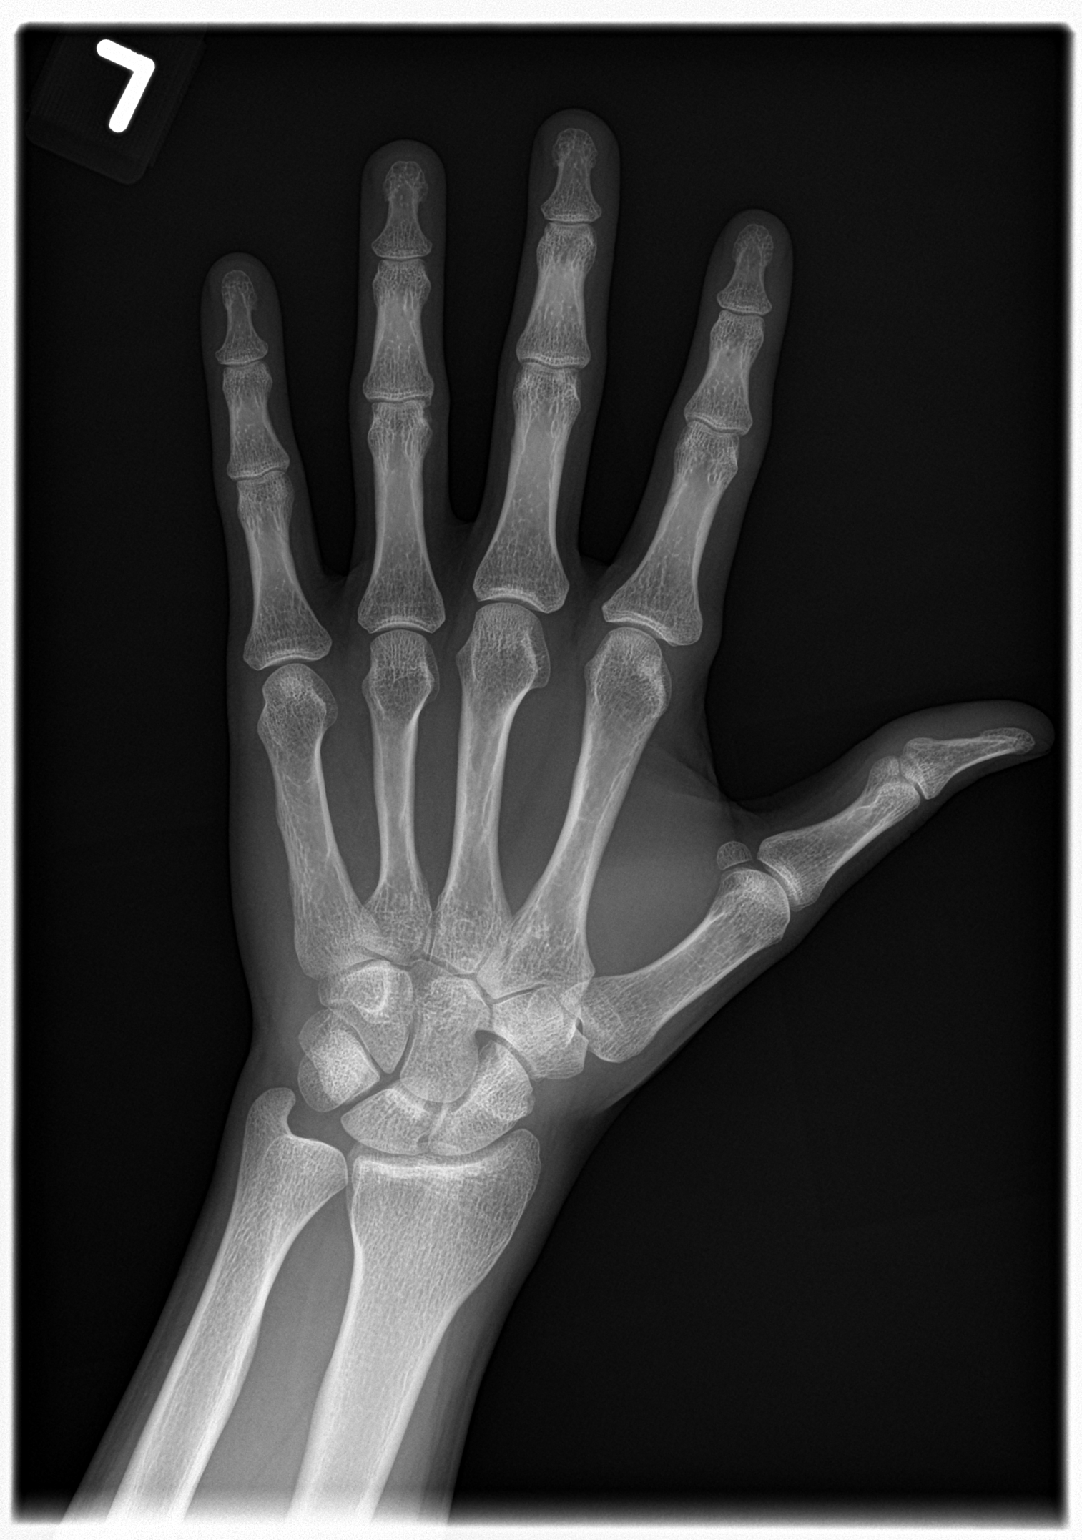
[im 2/3]
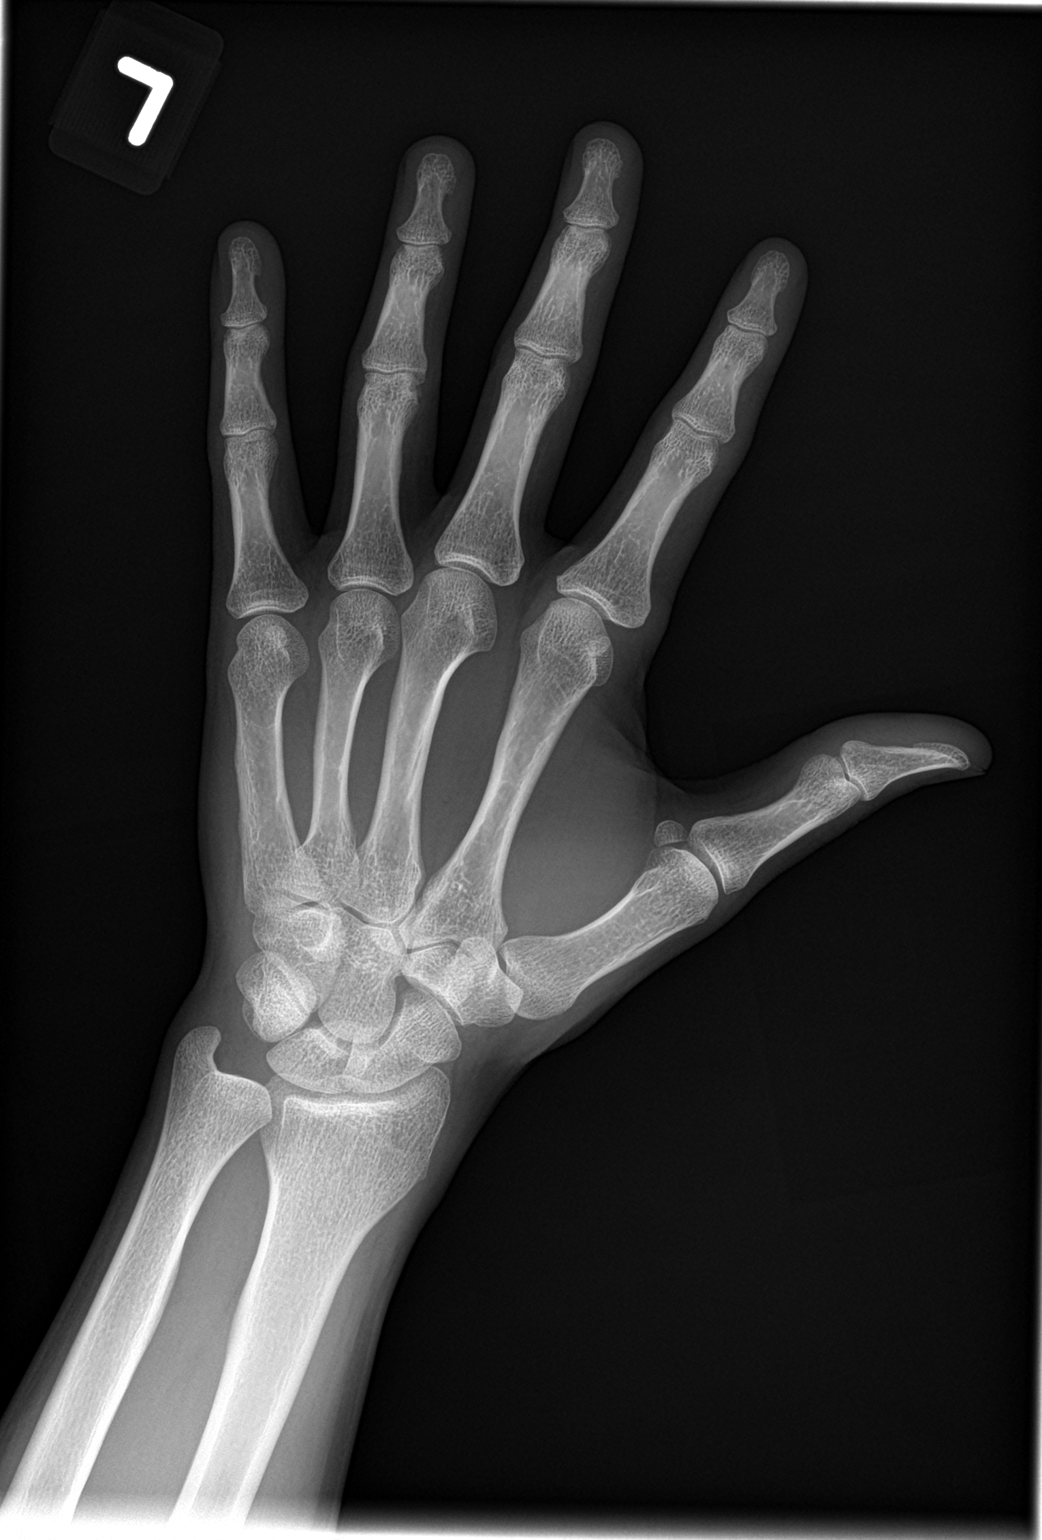
[im 3/3]
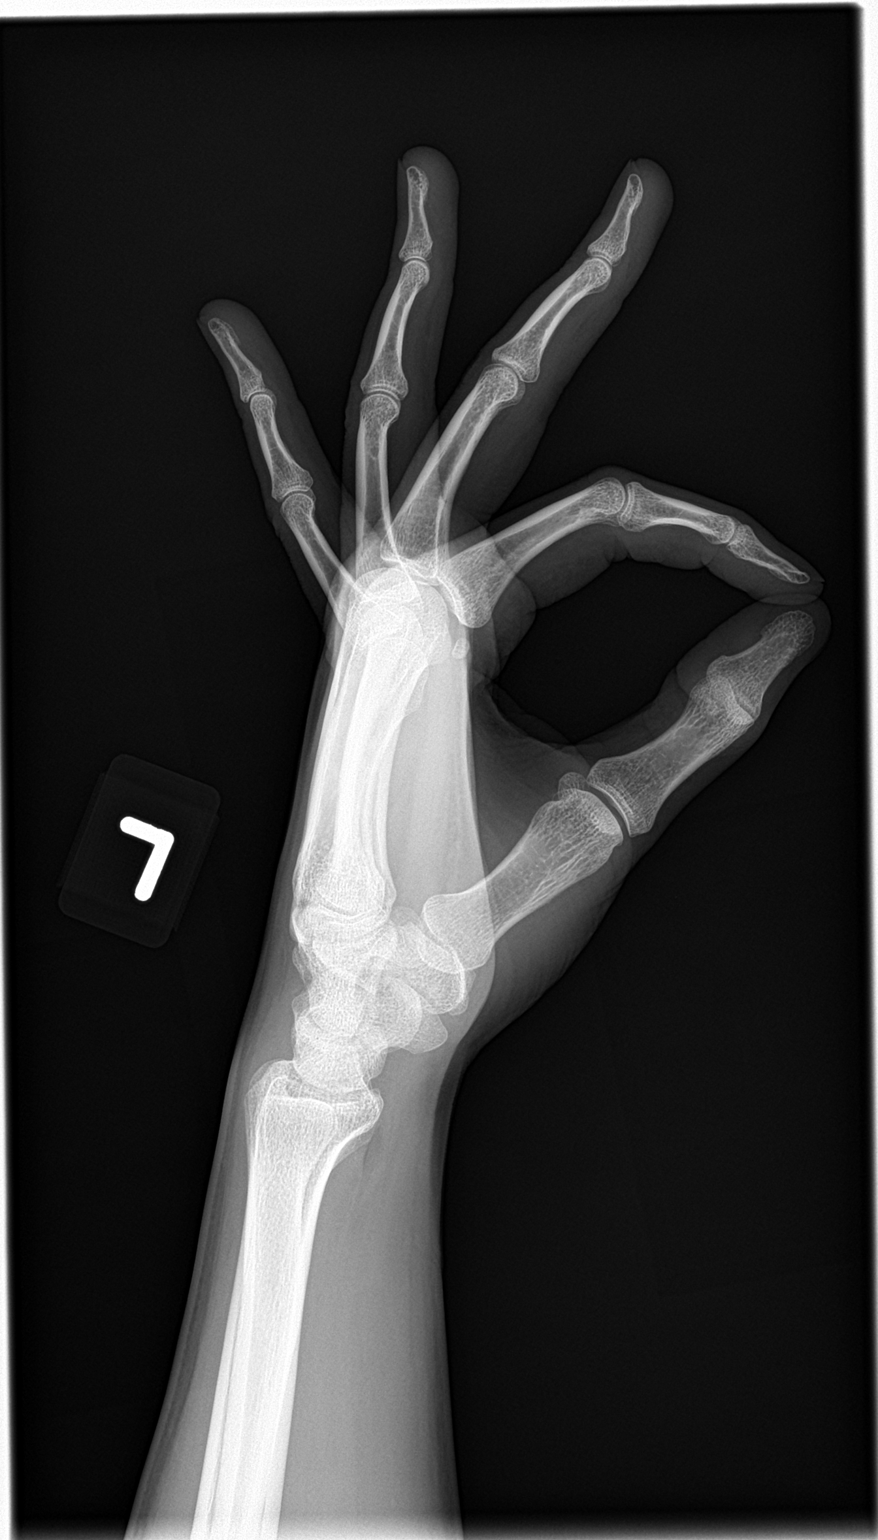

[3 of 3 positions shown; findings below may reference images not displayed]

FINDINGS: Frontal, oblique, and lateral views were obtained. There is no
evident fracture or dislocation. Joint spaces appear normal. No
erosive change.
IMPRESSION: No fracture or dislocation.  No evident arthropathy.

## 2019-04-15 IMAGING — CR DG WRIST COMPLETE 3+V*L*
1 series · 4 of 4 positions shown · non-contrast
Comparison: None.

CLINICAL DATA: Pain following fall

EXAM:
LEFT WRIST - COMPLETE 3+ VIEW

[Series 1: dg wrist complete left · 0.14mm/px · 4 of 4 slices shown]
[im 1/4]
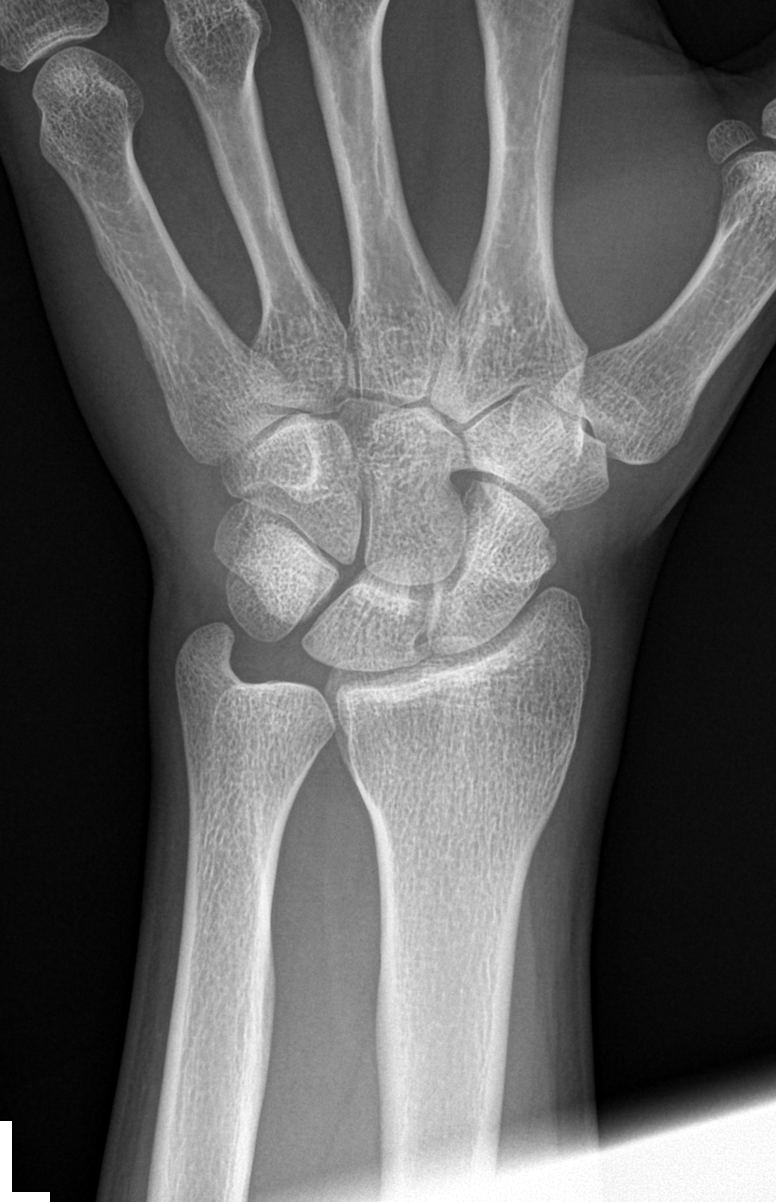
[im 2/4]
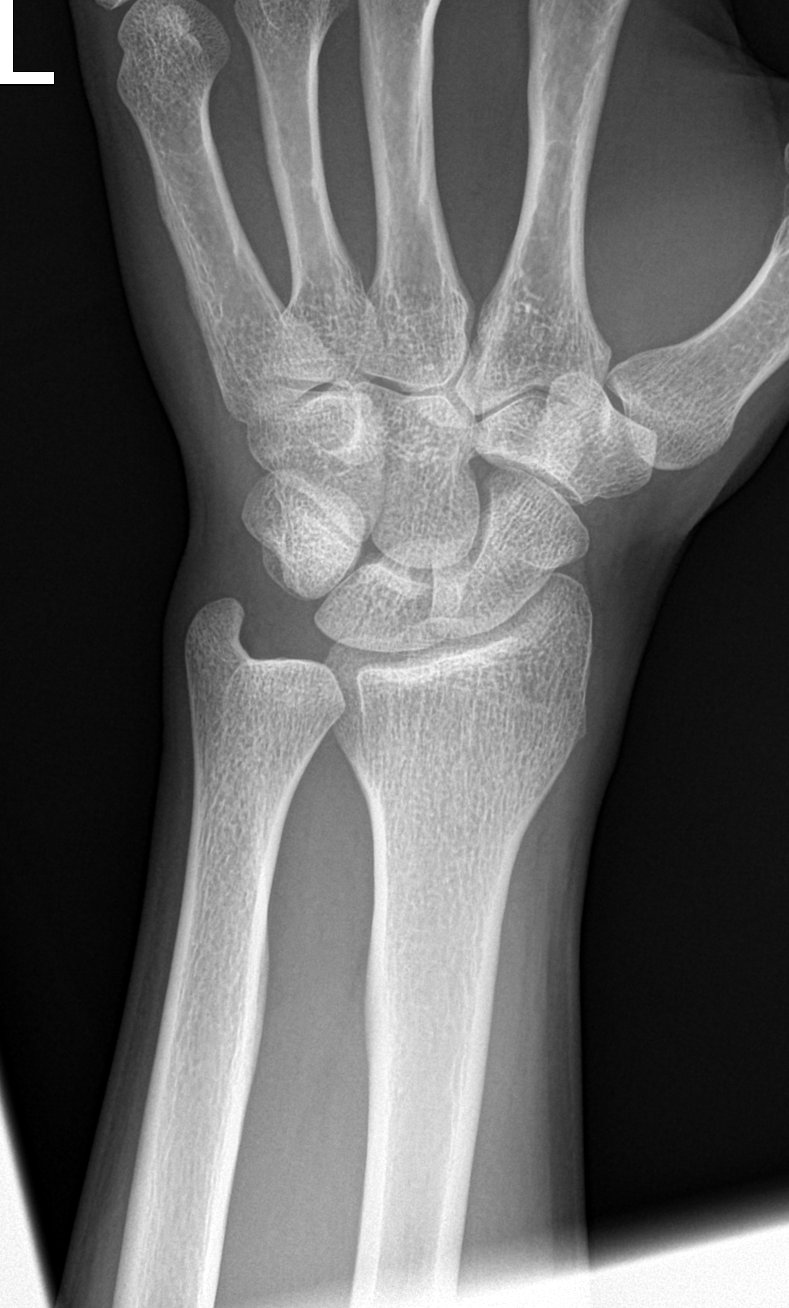
[im 3/4]
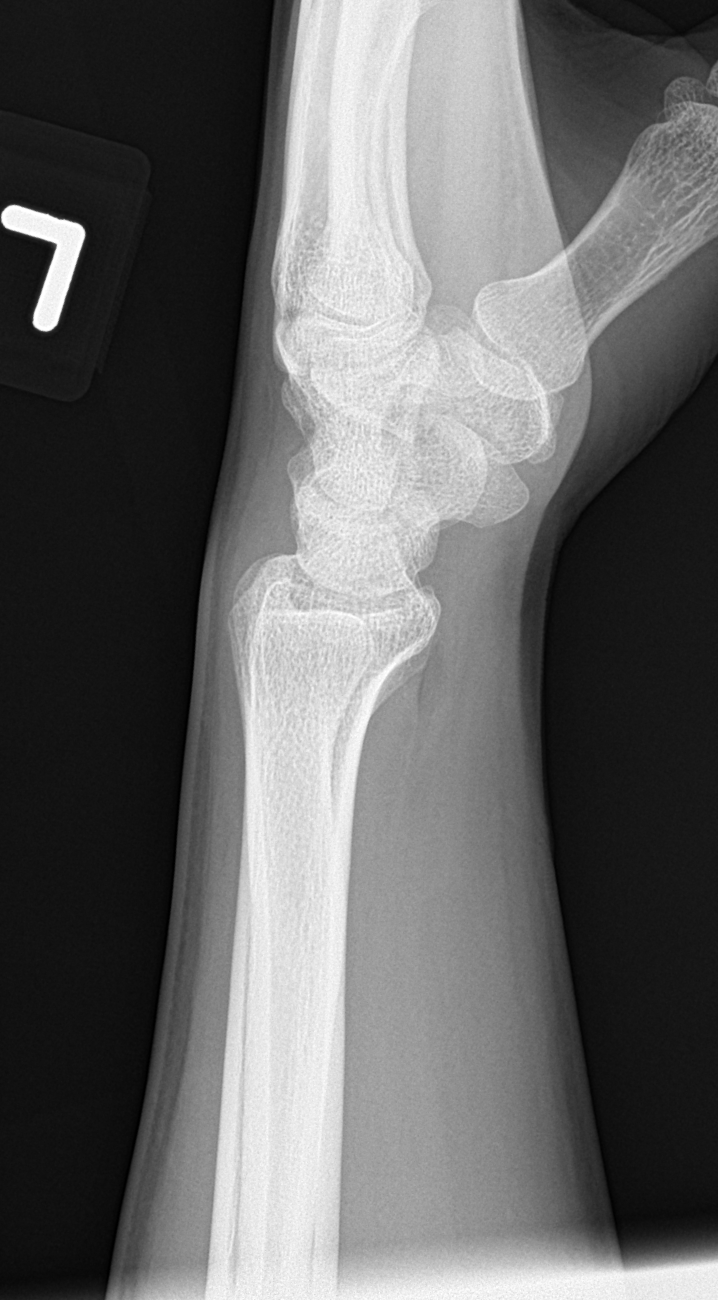
[im 4/4]
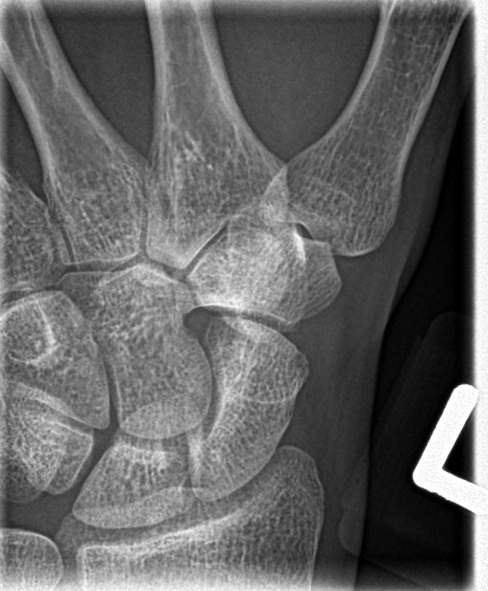

[4 of 4 positions shown; findings below may reference images not displayed]

FINDINGS: Frontal, oblique, lateral, and ulnar deviation scaphoid images were
obtained. There is no fracture or dislocation. Joint spaces appear
normal. No erosive change.
IMPRESSION: No fracture or dislocation.  No evident arthropathy.

## 2019-08-16 ENCOUNTER — Other Ambulatory Visit: Payer: Self-pay | Admitting: Family Medicine

## 2019-08-16 ENCOUNTER — Other Ambulatory Visit: Payer: Self-pay

## 2019-08-16 ENCOUNTER — Ambulatory Visit (INDEPENDENT_AMBULATORY_CARE_PROVIDER_SITE_OTHER): Payer: 59 | Admitting: Family Medicine

## 2019-08-16 ENCOUNTER — Encounter: Payer: Self-pay | Admitting: Family Medicine

## 2019-08-16 VITALS — BP 112/72 | HR 61 | Temp 97.8°F | Resp 16 | Ht 70.0 in | Wt 163.2 lb

## 2019-08-16 DIAGNOSIS — Z0000 Encounter for general adult medical examination without abnormal findings: Secondary | ICD-10-CM

## 2019-08-16 DIAGNOSIS — Z13220 Encounter for screening for lipoid disorders: Secondary | ICD-10-CM

## 2019-08-16 DIAGNOSIS — R591 Generalized enlarged lymph nodes: Secondary | ICD-10-CM

## 2019-08-16 DIAGNOSIS — Z131 Encounter for screening for diabetes mellitus: Secondary | ICD-10-CM

## 2019-08-16 MED ORDER — AMOXICILLIN-POT CLAVULANATE 875-125 MG PO TABS: 1.0000 | ORAL_TABLET | Freq: Two times a day (BID) | ORAL | 0 refills | Status: AC

## 2019-08-16 NOTE — Patient Instructions (Signed)
Preventive Care 19-33 Years Old, Male Preventive care refers to lifestyle choices and visits with your health care provider that can promote health and wellness. This includes:  A yearly physical exam. This is also called an annual well check.  Regular dental and eye exams.  Immunizations.  Screening for certain conditions.  Healthy lifestyle choices, such as eating a healthy diet, getting regular exercise, not using drugs or products that contain nicotine and tobacco, and limiting alcohol use. What can I expect for my preventive care visit? Physical exam Your health care provider will check:  Height and weight. These may be used to calculate body mass index (BMI), which is a measurement that tells if you are at a healthy weight.  Heart rate and blood pressure.  Your skin for abnormal spots. Counseling Your health care provider may ask you questions about:  Alcohol, tobacco, and drug use.  Emotional well-being.  Home and relationship well-being.  Sexual activity.  Eating habits.  Work and work Statistician. What immunizations do I need?  Influenza (flu) vaccine  This is recommended every year. Tetanus, diphtheria, and pertussis (Tdap) vaccine  You may need a Td booster every 10 years. Varicella (chickenpox) vaccine  You may need this vaccine if you have not already been vaccinated. Human papillomavirus (HPV) vaccine  If recommended by your health care provider, you may need three doses over 6 months. Measles, mumps, and rubella (MMR) vaccine  You may need at least one dose of MMR. You may also need a second dose. Meningococcal conjugate (MenACWY) vaccine  One dose is recommended if you are 45-76 years old and a Market researcher living in a residence hall, or if you have one of several medical conditions. You may also need additional booster doses. Pneumococcal conjugate (PCV13) vaccine  You may need this if you have certain conditions and were not  previously vaccinated. Pneumococcal polysaccharide (PPSV23) vaccine  You may need one or two doses if you smoke cigarettes or if you have certain conditions. Hepatitis A vaccine  You may need this if you have certain conditions or if you travel or work in places where you may be exposed to hepatitis A. Hepatitis B vaccine  You may need this if you have certain conditions or if you travel or work in places where you may be exposed to hepatitis B. Haemophilus influenzae type b (Hib) vaccine  You may need this if you have certain risk factors. You may receive vaccines as individual doses or as more than one vaccine together in one shot (combination vaccines). Talk with your health care provider about the risks and benefits of combination vaccines. What tests do I need? Blood tests  Lipid and cholesterol levels. These may be checked every 5 years starting at age 17.  Hepatitis C test.  Hepatitis B test. Screening   Diabetes screening. This is done by checking your blood sugar (glucose) after you have not eaten for a while (fasting).  Sexually transmitted disease (STD) testing. Talk with your health care provider about your test results, treatment options, and if necessary, the need for more tests. Follow these instructions at home: Eating and drinking   Eat a diet that includes fresh fruits and vegetables, whole grains, lean protein, and low-fat dairy products.  Take vitamin and mineral supplements as recommended by your health care provider.  Do not drink alcohol if your health care provider tells you not to drink.  If you drink alcohol: ? Limit how much you have to 0-2  drinks a day. ? Be aware of how much alcohol is in your drink. In the [place] one drink equals one 12 oz bottle of beer (355 mL), one 5 oz glass of wine (148 mL), or one 1 oz glass of hard liquor (44 mL). Lifestyle  Take daily care of your teeth and gums.  Stay active. Exercise for at least 30 minutes on 5 or  more days each week.  Do not use any products that contain nicotine or tobacco, such as cigarettes, e-cigarettes, and chewing tobacco. If you need help quitting, ask your health care provider.  If you are sexually active, practice safe sex. Use a condom or other form of protection to prevent STIs (sexually transmitted infections). What's next?  Go to your health care provider once a year for a well check visit.  Ask your health care provider how often you should have your eyes and teeth checked.  Stay up to date on all vaccines. This information is not intended to replace advice given to you by your health care provider. Make sure you discuss any questions you have with your health care provider. Document Released: 12/02/2001 Document Revised: 09/30/2018 Document Reviewed: 09/30/2018 Elsevier Patient Education  2020 [employer]  

## 2019-08-16 NOTE — Progress Notes (Signed)
Name: ABDULKARIM Mcpherson   MRN: [id number]    DOB: [birth year]   Date:08/16/2019       Progress Note  Subjective  Chief Complaint  Chief Complaint  Patient presents with  . Annual Exam    HPI  Patient presents for annual CPE.   USPSTF grade A and B recommendations:  Diet: He has a fairly good diet with use of plant based diet, he limits processed foods, soft drinks and eats pretty clean. Occasionally eats sweets.  Exercise: Tries to exercise at least 3-4 days a week by riding his bike for about an hour and then will try to walk for an hour.   Depression: phq 9 is negative Depression screen [facility] 2/9 08/16/2019 07/09/2018 06/09/2017 01/11/2016 11/29/2015  Decreased Interest 0 0 0 0 0  Down, Depressed, Hopeless 0 0 0 0 0  PHQ - 2 Score 0 0 0 0 0  Altered sleeping 0 0 - - -  Tired, decreased energy 0 0 - - -  Change in appetite 0 0 - - -  Feeling bad or failure about yourself  0 0 - - -  Trouble concentrating 0 0 - - -  Moving slowly or fidgety/restless 0 - - - -  Suicidal thoughts 0 0 - - -  PHQ-9 Score 0 0 - - -  Difficult doing work/chores Not difficult at all Not difficult at all - - -    Hypertension:  BP Readings from Last 3 Encounters:  08/16/19 112/72  07/09/18 110/68  06/09/17 104/76    Obesity: Wt Readings from Last 3 Encounters:  08/16/19 74 kg  07/09/18 75.6 kg  06/09/17 78.1 kg   BMI Readings from Last 3 Encounters:  08/16/19 23.42 kg/m  07/09/18 23.92 kg/m  06/09/17 24.69 kg/m     Lipids:  Lab Results  Component Value Date   CHOL 118 07/09/2018   CHOL 130 01/11/2016   Lab Results  Component Value Date   HDL 47 07/09/2018   HDL 53 01/11/2016   Lab Results  Component Value Date   LDLCALC 59 07/09/2018   LDLCALC 67 01/11/2016   Lab Results  Component Value Date   TRIG 42 07/09/2018   TRIG 52 01/11/2016   Lab Results  Component Value Date   CHOLHDL 2.5 07/09/2018   CHOLHDL 2.5 01/11/2016   No results found for:  LDLDIRECT Glucose:  Glucose  Date Value Ref Range Status  01/11/2016 97 65 - 99 mg/dL Final   Glucose, Bld  Date Value Ref Range Status  07/09/2018 87 65 - 99 mg/dL Final    Comment:    .            Fasting reference interval .       Office Visit from 08/16/2019 in [facility]  AUDIT-C Score  0      Married to [place] for 4.5 years with 1 child who is 8 mnths.  STD testing and prevention (HIV/chl/gon/syphilis): Previously screened in 2019 Hep C:  Previously screened in 2019  Skin cancer: of no concern, uses sun screen  Colorectal cancer: no familial history or concerns  Prostate cancer:no familial history or concerns/changes in urination No results found for: PSA  Lung cancer:  Low Dose CT Chest recommended if Age 51-80 years, 30 pack-year currently smoking OR have quit w/in 15years. Patient does not qualify.   AAA: The USPSTF recommends one-time screening with ultrasonography in men ages 7 to 53 years who have ever smoked ECG:  No concerns.   Advanced Care Planning: A voluntary discussion about advance care planning including the explanation and discussion of advance directives.  Discussed health care proxy and Living will, and the patient was able to identify a health care proxy as Alexander Mcpherson wife.  Patient does have a living will at present time. If patient does have living will, I have requested they bring this to the clinic to be scanned in to their chart.  Patient Active Problem List   Diagnosis Date Noted  . Annual physical exam 01/11/2016  . Encounter to establish care with new doctor 11/29/2015    Past Surgical History:  Procedure Laterality Date  . NASAL SEPTUM SURGERY  2007  . WISDOM TOOTH EXTRACTION Bilateral 2004    Family History  Problem Relation Age of Onset  . Diabetes type II Mother   . Healthy Father   . Diabetes type II Maternal Grandmother   . Heart attack Maternal Grandmother [id number]    Social History   Socioeconomic  History  . Marital status: Married    Spouse name: Alexander Mcpherson  . Number of children: 1  . Years of education: Not on file  . Highest education level: Not on file  Occupational History  . Occupation: Pharmacist, communityractice Manager    Employer:     Comment: Works at [employer] in the [employer]  Social Needs  . Financial resource strain: Not hard at all  . Food insecurity    Worry: Never true    Inability: Never true  . Transportation needs    Medical: No    Non-medical: No  Tobacco Use  . Smoking status: Never Smoker  . Smokeless tobacco: Never Used  Substance and Sexual Activity  . Alcohol use: No    Alcohol/week: 0.0 standard drinks  . Drug use: No  . Sexual activity: Yes    Partners: Female  Lifestyle  . Physical activity    Days per week: 4 days    Minutes per session: 60 min  . Stress: Not at all  Relationships  . Social Musicianconnections    Talks on phone: Three times a week    Gets together: Twice a week    Attends religious service: More than 4 times per year    Active member of club or organization: Yes    Attends meetings of clubs or organizations: More than 4 times per year    Relationship status: Married  . Intimate partner violence    Fear of current or ex partner: No    Emotionally abused: No    Physically abused: No    Forced sexual activity: No  Other Topics Concern  . 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  Albuterol Sulfate (PROAIR RESPICLICK) 108 (90 Base) MCG/ACT AEPB, Inhale 1-2 puffs into the lungs every 4 (four) hours as needed (Wheezing/coughing)., Disp: 1 each, Rfl: 3 .  cholecalciferol (VITAMIN D3) 25 MCG (1000 UT) tablet, Take 1,000 Units by mouth daily., Disp: , Rfl:  .  Vitamin D, Ergocalciferol, (DRISDOL) 50000 units CAPS capsule, Take 1 capsule (50,000 Units total) by mouth once a week. For 12 weeks (Patient not taking: Reported on 08/16/2019), Disp: 12 capsule,  Rfl: 0  No Known Allergies   Review of Systems  Constitutional: Negative.   HENT: Negative.   Eyes: Negative.   Respiratory: Negative.   Cardiovascular: Negative.   Gastrointestinal: Negative.  Genitourinary: Negative.   Musculoskeletal: Negative.   Skin: Negative.   Neurological: Negative.   Endo/Heme/Allergies: Positive for environmental allergies.  Psychiatric/Behavioral: Negative.   All other systems reviewed and are negative.      Objective  Vitals:   08/16/19 0847  BP: 112/72  Pulse: 61  Resp: 16  Temp: 97.8 F (36.6 C)  TempSrc: Temporal  SpO2: 99%  Weight: 74 kg  Height: 5\' 10"  (1.778 m)    Body mass index is 23.42 kg/m.  Physical Exam Vitals signs reviewed.  Constitutional:      Appearance: Normal appearance.  HENT:     Head: Normocephalic and atraumatic.     Nose: Nose normal.     Mouth/Throat:     Mouth: Mucous membranes are moist.     Pharynx: Oropharynx is clear.  Eyes:     Pupils: Pupils are equal, round, and reactive to light.  Neck:     Musculoskeletal: Normal range of motion and neck supple.  Cardiovascular:     Rate and Rhythm: Normal rate and regular rhythm.     Pulses: Normal pulses.     Heart sounds: Normal heart sounds.  Pulmonary:     Effort: Pulmonary effort is normal.     Breath sounds: Normal breath sounds.  Abdominal:     General: Abdomen is flat. Bowel sounds are normal.     Palpations: Abdomen is soft.  Musculoskeletal: Normal range of motion.  Lymphadenopathy:     Cervical: Cervical adenopathy (There is a discrete, round, firm, easily moveable, non-tender lesion to the left proximal cervical lymph chain) present.  Skin:    General: Skin is warm and dry.  Neurological:     General: No focal deficit present.     Mental Status: He is alert.  Psychiatric:        Mood and Affect: Mood normal.        Thought Content: Thought content normal.        Judgment: Judgment normal.      No results found for this or any  previous visit (from the past 2160 hour(s)).   PHQ2/9: Depression screen [facility] 2/9 08/16/2019 07/09/2018 06/09/2017 01/11/2016 11/29/2015  Decreased Interest 0 0 0 0 0  Down, Depressed, Hopeless 0 0 0 0 0  PHQ - 2 Score 0 0 0 0 0  Altered sleeping 0 0 - - -  Tired, decreased energy 0 0 - - -  Change in appetite 0 0 - - -  Feeling bad or failure about yourself  0 0 - - -  Trouble concentrating 0 0 - - -  Moving slowly or fidgety/restless 0 - - - -  Suicidal thoughts 0 0 - - -  PHQ-9 Score 0 0 - - -  Difficult doing work/chores Not difficult at all Not difficult at all - - -     Fall Risk: Fall Risk  08/16/2019 07/09/2018 06/09/2017 01/11/2016 11/29/2015  Falls in the past year? 0 No No No No  Number falls in past yr: 0 - - - -  Injury with Fall? 0 - - - -  Follow up Falls evaluation completed - - - -     Assessment & Plan  1. Annual physical exam -Prostate cancer screening and PSA options (with potential risks and benefits of testing vs not testing) were discussed along with recent recs/guidelines. -USPSTF grade A and B recommendations reviewed with patient; age-appropriate recommendations, preventive care, screening tests, etc discussed and encouraged; healthy living encouraged; see  AVS for patient education given to patient -Discussed importance of 150 minutes of physical activity weekly, eat two servings of fish weekly, eat one serving of tree nuts ( cashews, pistachios, pecans, almonds.Marland Kitchen) every other day, eat 6 servings of fruit/vegetables daily and drink plenty of water and avoid sweet beverages.  - Lipid Profile  2. Lymphadenopathy - CBC w/Diff/Platelet - amoxicillin-clavulanate (AUGMENTIN) 875-125 MG tablet; Take 1 tablet by mouth 2 (two) times daily for 10 days.  Dispense: 20 tablet; Refill: 0 -He will monitor the clavicular lympnode and follow up after antibiotic is completed if not resolved and we will refer to general surgery.  3. Diabetes mellitus screening - Comprehensive  metabolic panel  4. Lipid screening - Lipid Profile

## 2019-08-17 LAB — COMPREHENSIVE METABOLIC PANEL
ALT: 11 IU/L (ref 0–44)
AST: 14 IU/L (ref 0–40)
Albumin/Globulin Ratio: 2 (ref 1.2–2.2)
Albumin: 4.9 g/dL (ref 4.0–5.0)
Alkaline Phosphatase: 54 IU/L (ref 39–117)
BUN/Creatinine Ratio: 12 (ref 9–20)
BUN: 14 mg/dL (ref 6–20)
Bilirubin Total: 0.6 mg/dL (ref 0.0–1.2)
CO2: 24 mmol/L (ref 20–29)
Calcium: 9.8 mg/dL (ref 8.7–10.2)
Chloride: 104 mmol/L (ref 96–106)
Creatinine, Ser: 1.14 mg/dL (ref 0.76–1.27)
GFR calc Af Amer: 97 mL/min/{1.73_m2} (ref >59)
GFR calc non Af Amer: 84 mL/min/{1.73_m2} (ref >59)
Globulin, Total: 2.4 g/dL (ref 1.5–4.5)
Glucose: 92 mg/dL (ref 65–99)
Potassium: 4.8 mmol/L (ref 3.5–5.2)
Sodium: 142 mmol/L (ref 134–144)
Total Protein: 7.3 g/dL (ref 6.0–8.5)

## 2019-08-17 LAB — CBC WITH DIFFERENTIAL/PLATELET
Basophils Absolute: 0 10*3/uL (ref 0.0–0.2)
Basos: 1 %
EOS (ABSOLUTE): 0.1 10*3/uL (ref 0.0–0.4)
Eos: 2 %
Hematocrit: 45.3 % (ref 37.5–51.0)
Hemoglobin: 15.2 g/dL (ref 13.0–17.7)
Immature Grans (Abs): 0 10*3/uL (ref 0.0–0.1)
Immature Granulocytes: 0 %
Lymphocytes Absolute: 1.5 10*3/uL (ref 0.7–3.1)
Lymphs: 32 %
MCH: 28.7 pg (ref 26.6–33.0)
MCHC: 33.6 g/dL (ref 31.5–35.7)
MCV: 86 fL (ref 79–97)
Monocytes Absolute: 0.4 10*3/uL (ref 0.1–0.9)
Monocytes: 8 %
Neutrophils Absolute: 2.7 10*3/uL (ref 1.4–7.0)
Neutrophils: 57 %
Platelets: 180 10*3/uL (ref 150–450)
RBC: 5.3 x10E6/uL (ref 4.14–5.80)
RDW: 13 % (ref 11.6–15.4)
WBC: 4.7 10*3/uL (ref 3.4–10.8)

## 2019-08-17 LAB — LIPID PANEL
Chol/HDL Ratio: 2.5 ratio (ref 0.0–5.0)
Cholesterol, Total: 137 mg/dL (ref 100–199)
HDL: 55 mg/dL (ref >39)
LDL Chol Calc (NIH): 72 mg/dL (ref 0–99)
Triglycerides: 45 mg/dL (ref 0–149)
VLDL Cholesterol Cal: 10 mg/dL (ref 5–40)

## 2019-09-26 DIAGNOSIS — H5213 Myopia, bilateral: Secondary | ICD-10-CM | POA: Diagnosis not present

## 2019-09-26 DIAGNOSIS — H52223 Regular astigmatism, bilateral: Secondary | ICD-10-CM | POA: Diagnosis not present

## 2021-10-15 ENCOUNTER — Other Ambulatory Visit: Payer: Self-pay | Admitting: Family

## 2021-10-15 ENCOUNTER — Other Ambulatory Visit: Payer: Self-pay

## 2021-10-15 ENCOUNTER — Ambulatory Visit (INDEPENDENT_AMBULATORY_CARE_PROVIDER_SITE_OTHER): Payer: BC Managed Care – PPO | Admitting: Family

## 2021-10-15 ENCOUNTER — Encounter: Payer: Self-pay | Admitting: Family

## 2021-10-15 VITALS — BP 132/77 | HR 56 | Temp 98.0°F | Ht 70.0 in | Wt 163.8 lb

## 2021-10-15 DIAGNOSIS — Z0000 Encounter for general adult medical examination without abnormal findings: Secondary | ICD-10-CM

## 2021-10-15 DIAGNOSIS — E559 Vitamin D deficiency, unspecified: Secondary | ICD-10-CM | POA: Insufficient documentation

## 2021-10-15 DIAGNOSIS — R5383 Other fatigue: Secondary | ICD-10-CM | POA: Insufficient documentation

## 2021-10-15 NOTE — Patient Instructions (Signed)
Welcome to [facility]! It was a pleasure meeting you today.    PLEASE NOTE:  If you had any LAB tests please let us know if you have not heard back within a few days. You may see your results on MyChart before we have a chance to review them but we will give you a call once they are reviewed by us. If we ordered any REFERRALS today, please let us know if you have not heard from their office within the next week.  Let us know through MyChart if you are needing REFILLS, or have your pharmacy send us the request. You can also use MyChart to communicate with me or any office staff.  Please try these tips to maintain a healthy lifestyle:  Eat most of your calories during the day when you are active. Eliminate processed foods including packaged sweets (pies, cakes, cookies), reduce intake of potatoes, white bread, white pasta, and white rice. Look for whole grain options, oat flour or almond flour.  Each meal should contain half fruits/vegetables, one quarter protein, and one quarter carbs (no bigger than a computer mouse).  Cut down on sweet beverages. This includes juice, soda, and sweet tea. Also watch fruit intake, though this is a healthier sweet option, it still contains natural sugar! Limit to 3 servings daily.  Drink at least 1 glass of water with each meal and aim for at least 8 glasses per day  Exercise at least 150 minutes every week.   

## 2021-10-15 NOTE — Assessment & Plan Note (Signed)
hx of low Vitamin D. also requesting testosterone level. denies any other sx r/t low testosterone.

## 2021-10-15 NOTE — Progress Notes (Signed)
Phone: [phone]   Subjective:  Patient 35 y.o. male presenting for annual physical.  Chief Complaint  Patient presents with   Annual Exam    W/labs   Fatigue    Hx of Vit D Deficiency     See problem oriented charting- ROS- full  review of systems was completed and negative  except for: Vitamin D deficiency  The following were reviewed and entered/updated in epic: Past Medical History:  Diagnosis Date   Allergic to cats    Allergy    Asthma    Environmental and seasonal allergies    Patient Active Problem List   Diagnosis Date Noted   Vitamin D deficiency 10/15/2021   Other fatigue 10/15/2021   Annual physical exam 01/11/2016   Encounter to establish care with new doctor 11/29/2015   Past Surgical History:  Procedure Laterality Date   NASAL SEPTUM SURGERY  2007   WISDOM TOOTH EXTRACTION Bilateral 2004    Family History  Problem Relation Age of Onset   Diabetes type II Mother    Healthy Father    Diabetes type II Maternal Grandmother    Heart attack Maternal Grandmother 69    Medications- reviewed and updated Current Outpatient Medications  Medication Sig Dispense Refill   cholecalciferol (VITAMIN D3) 25 MCG (1000 UT) tablet Take 1,000 Units by mouth daily.     zinc gluconate 50 MG tablet Take 50 mg by mouth daily.     Albuterol Sulfate (PROAIR RESPICLICK) 108 (90 Base) MCG/ACT AEPB Inhale 1-2 puffs into the lungs every 4 (four) hours as needed (Wheezing/coughing). (Patient not taking: Reported on 10/15/2021) 1 each 3   Vitamin D, Ergocalciferol, (DRISDOL) 50000 units CAPS capsule Take 1 capsule (50,000 Units total) by mouth once a week. For 12 weeks (Patient not taking: Reported on 08/16/2019) 12 capsule 0   No current facility-administered medications for this visit.    Allergies-reviewed and updated No Known Allergies  Social History   Social History Archivist for [employer]   Objective  Objective:  BP 132/77    Pulse  (!) 56    Temp 98 F (36.7 C) (Temporal)    Ht 5\' 10"  (1.778 m)    Wt 163 lb 12.8 oz (74.3 kg)    SpO2 100%    BMI 23.50 kg/m  Gen: NAD, resting comfortably HEENT: Mucous membranes are moist. Oropharynx normal Neck: no thyromegaly CV: RRR no murmurs rubs or gallops Lungs: CTAB no crackles, wheeze, rhonchi Abdomen: soft/nontender/nondistended/normal bowel sounds. No rebound or guarding.  Ext: no edema Skin: warm, dry Neuro: grossly normal, moves all extremities, PERRLA    Assessment and Plan   Health Maintenance counseling: 1. Anticipatory guidance: Patient counseled regarding regular dental exams q6 months, eye exams yearly, avoiding smoking and second hand smoke, limiting alcohol to 2 beverages per day.   2. Risk factor reduction:  Advised patient of need for regular exercise and diet rich in fruits and vegetables to reduce risk of heart attack and stroke.    Wt Readings from Last 3 Encounters:  10/15/21 163 lb 12.8 oz (74.3 kg)  08/16/19 163 lb 3.2 oz (74 kg)  07/09/18 166 lb 11.2 oz (75.6 kg)   3. Immunizations/screenings/ancillary studies Immunization History  Administered Date(s) Administered   Influenza-Unspecified 07/21/2015   There are no preventive care reminders to display for this patient.  4. Skin cancer screening- advised regular sunscreen use. Denies worrisome, changing, or new skin lesions.  5. Smoking associated screening: non-  smoker  6. STD screening - N/A   Problem List Items Addressed This Visit       Other   Annual physical exam - Primary   Relevant Orders   CBC with Differential/Platelet   Comprehensive metabolic panel   Lipid panel   TSH   Vitamin D deficiency   Relevant Orders   Vitamin D (25 hydroxy)   Other fatigue   Relevant Orders   Testosterone, Free, Total, SHBG    Recommended follow up: Return for as needed for future concerns. No future appointments.   Lab/Order associations: fasting   ICD-10-CM   1. Annual physical exam   Z00.00 CBC with Differential/Platelet    Comprehensive metabolic panel    Lipid panel    TSH    CANCELED: Comprehensive metabolic panel    CANCELED: TSH    CANCELED: Lipid panel    CANCELED: CBC with Differential/Platelet    2. Vitamin D deficiency  E55.9 Vitamin D (25 hydroxy)    CANCELED: Vitamin D (25 hydroxy)    3. Other fatigue  R53.83 Testosterone, Free, Total, SHBG    CANCELED: Testosterone, Free, Total, SHBG      No orders of the defined types were placed in this encounter.    Dulce Sellar, NP

## 2021-10-15 NOTE — Assessment & Plan Note (Signed)
reports taking OTC 5,000 for a while, did not take RX.

## 2021-10-16 LAB — SPECIMEN STATUS REPORT

## 2021-10-17 LAB — CBC WITH DIFFERENTIAL/PLATELET
Basophils Absolute: 0 10*3/uL (ref 0.0–0.2)
Basos: 1 %
EOS (ABSOLUTE): 0.1 10*3/uL (ref 0.0–0.4)
Eos: 2 %
Hematocrit: 43.3 % (ref 37.5–51.0)
Hemoglobin: 14.4 g/dL (ref 13.0–17.7)
Immature Grans (Abs): 0 10*3/uL (ref 0.0–0.1)
Immature Granulocytes: 0 %
Lymphocytes Absolute: 1.6 10*3/uL (ref 0.7–3.1)
Lymphs: 39 %
MCH: 28.4 pg (ref 26.6–33.0)
MCHC: 33.3 g/dL (ref 31.5–35.7)
MCV: 85 fL (ref 79–97)
Monocytes Absolute: 0.3 10*3/uL (ref 0.1–0.9)
Monocytes: 7 %
Neutrophils Absolute: 2.1 10*3/uL (ref 1.4–7.0)
Neutrophils: 51 %
Platelets: 166 10*3/uL (ref 150–450)
RBC: 5.07 x10E6/uL (ref 4.14–5.80)
RDW: 12.9 % (ref 11.6–15.4)
WBC: 4.1 10*3/uL (ref 3.4–10.8)

## 2021-10-17 LAB — COMPREHENSIVE METABOLIC PANEL
ALT: 12 IU/L (ref 0–44)
AST: 17 IU/L (ref 0–40)
Albumin/Globulin Ratio: 1.7 (ref 1.2–2.2)
Albumin: 4.5 g/dL (ref 4.0–5.0)
Alkaline Phosphatase: 57 IU/L (ref 44–121)
BUN/Creatinine Ratio: 12 (ref 9–20)
BUN: 12 mg/dL (ref 6–20)
Bilirubin Total: 0.6 mg/dL (ref 0.0–1.2)
CO2: 24 mmol/L (ref 20–29)
Calcium: 9.4 mg/dL (ref 8.7–10.2)
Chloride: 102 mmol/L (ref 96–106)
Creatinine, Ser: 1.01 mg/dL (ref 0.76–1.27)
Globulin, Total: 2.6 g/dL (ref 1.5–4.5)
Glucose: 85 mg/dL (ref 70–99)
Potassium: 3.9 mmol/L (ref 3.5–5.2)
Sodium: 141 mmol/L (ref 134–144)
Total Protein: 7.1 g/dL (ref 6.0–8.5)
eGFR: 99 mL/min/{1.73_m2} (ref >59)

## 2021-10-17 LAB — LIPID PANEL W/O CHOL/HDL RATIO
Cholesterol, Total: 115 mg/dL (ref 100–199)
HDL: 40 mg/dL (ref >39)
LDL Chol Calc (NIH): 62 mg/dL (ref 0–99)
Triglycerides: 57 mg/dL (ref 0–149)
VLDL Cholesterol Cal: 13 mg/dL (ref 5–40)

## 2021-10-17 LAB — SEX HORMONE BINDING GLOBULIN: Sex Hormone Binding: 71.9 nmol/L — ABNORMAL HIGH (ref 16.5–55.9)

## 2021-10-17 LAB — VITAMIN D 25 HYDROXY (VIT D DEFICIENCY, FRACTURES): Vit D, 25-Hydroxy: 43 ng/mL (ref 30.0–100.0)

## 2021-10-17 LAB — TSH: TSH: 1.62 u[IU]/mL (ref 0.450–4.500)

## 2021-10-17 LAB — TESTOSTERONE,FREE AND TOTAL
Testosterone, Free: 9.3 pg/mL (ref 8.7–25.1)
Testosterone: 712 ng/dL (ref 264–916)

## 2021-10-17 NOTE — Progress Notes (Signed)
All labs are within normal range! Testosterone and Vit. D also normal.  Keep up the good work with controlling your diet and continue to try and shoot for 30 minutes of exercise daily!

## 2022-01-03 ENCOUNTER — Other Ambulatory Visit: Payer: Self-pay | Admitting: Family

## 2022-06-15 ENCOUNTER — Other Ambulatory Visit: Payer: Self-pay

## 2022-06-15 ENCOUNTER — Emergency Department: Payer: BC Managed Care – PPO

## 2022-06-15 ENCOUNTER — Encounter: Payer: Self-pay | Admitting: Intensive Care

## 2022-06-15 ENCOUNTER — Emergency Department
Admission: EM | Admit: 2022-06-15 | Discharge: 2022-06-15 | Disposition: A | Discharge status: Home / Self Care | Payer: BC Managed Care – PPO | Attending: Emergency Medicine | Admitting: Emergency Medicine

## 2022-06-15 DIAGNOSIS — N132 Hydronephrosis with renal and ureteral calculous obstruction: Secondary | ICD-10-CM | POA: Diagnosis not present

## 2022-06-15 DIAGNOSIS — R109 Unspecified abdominal pain: Secondary | ICD-10-CM | POA: Diagnosis present

## 2022-06-15 DIAGNOSIS — N200 Calculus of kidney: Secondary | ICD-10-CM

## 2022-06-15 LAB — URINALYSIS, ROUTINE W REFLEX MICROSCOPIC
Bilirubin Urine: NEGATIVE
Glucose, UA: NEGATIVE mg/dL
Ketones, ur: NEGATIVE mg/dL
Leukocytes,Ua: NEGATIVE
Nitrite: NEGATIVE
Protein, ur: NEGATIVE mg/dL
RBC / HPF: >50 RBC/hpf — ABNORMAL HIGH (ref 0–5)
Specific Gravity, Urine: 1.011 (ref 1.005–1.030)
pH: 5 (ref 5.0–8.0)

## 2022-06-15 LAB — CBC WITH DIFFERENTIAL/PLATELET
Abs Immature Granulocytes: 0.01 10*3/uL (ref 0.00–0.07)
Basophils Absolute: 0 10*3/uL (ref 0.0–0.1)
Basophils Relative: 1 %
Eosinophils Absolute: 0.1 10*3/uL (ref 0.0–0.5)
Eosinophils Relative: 1 %
HCT: 43.6 % (ref 39.0–52.0)
Hemoglobin: 14.8 g/dL (ref 13.0–17.0)
Immature Granulocytes: 0 %
Lymphocytes Relative: 45 %
Lymphs Abs: 2.3 10*3/uL (ref 0.7–4.0)
MCH: 28 pg (ref 26.0–34.0)
MCHC: 33.9 g/dL (ref 30.0–36.0)
MCV: 82.6 fL (ref 80.0–100.0)
Monocytes Absolute: 0.3 10*3/uL (ref 0.1–1.0)
Monocytes Relative: 5 %
Neutro Abs: 2.5 10*3/uL (ref 1.7–7.7)
Neutrophils Relative %: 48 %
Platelets: 190 10*3/uL (ref 150–400)
RBC: 5.28 MIL/uL (ref 4.22–5.81)
RDW: 13.1 % (ref 11.5–15.5)
WBC: 5.2 10*3/uL (ref 4.0–10.5)
nRBC: 0 % (ref 0.0–0.2)

## 2022-06-15 LAB — COMPREHENSIVE METABOLIC PANEL
ALT: 32 U/L (ref 0–44)
AST: 27 U/L (ref 15–41)
Albumin: 4.7 g/dL (ref 3.5–5.0)
Alkaline Phosphatase: 55 U/L (ref 38–126)
Anion gap: 11 (ref 5–15)
BUN: 20 mg/dL (ref 6–20)
CO2: 26 mmol/L (ref 22–32)
Calcium: 9.3 mg/dL (ref 8.9–10.3)
Chloride: 101 mmol/L (ref 98–111)
Creatinine, Ser: 1.18 mg/dL (ref 0.61–1.24)
GFR, Estimated: >60 mL/min (ref >60)
Glucose, Bld: 103 mg/dL — ABNORMAL HIGH (ref 70–99)
Potassium: 3.7 mmol/L (ref 3.5–5.1)
Sodium: 138 mmol/L (ref 135–145)
Total Bilirubin: 0.7 mg/dL (ref 0.3–1.2)
Total Protein: 8.1 g/dL (ref 6.5–8.1)

## 2022-06-15 MED ORDER — KETOROLAC TROMETHAMINE 30 MG/ML IJ SOLN
30.0000 mg | Freq: Once | INTRAMUSCULAR | Status: AC
  Administered 2022-06-15: 30 mg via INTRAVENOUS
  Filled 2022-06-15: qty 1

## 2022-06-15 MED ORDER — OXYCODONE HCL 5 MG PO TABS
5.0000 mg | ORAL_TABLET | Freq: Three times a day (TID) | ORAL | 0 refills | Status: DC | PRN
  Filled 2022-06-15: qty 8, 3d supply, fill #0

## 2022-06-15 MED ORDER — TAMSULOSIN HCL 0.4 MG PO CAPS
0.4000 mg | ORAL_CAPSULE | Freq: Once | ORAL | Status: AC
  Administered 2022-06-15: 0.4 mg via ORAL
  Filled 2022-06-15: qty 1

## 2022-06-15 MED ORDER — OXYCODONE HCL 5 MG PO TABS: 5.0000 mg | ORAL_TABLET | Freq: Three times a day (TID) | ORAL | 0 refills | Status: DC | PRN

## 2022-06-15 MED ORDER — MORPHINE SULFATE (PF) 4 MG/ML IV SOLN
4.0000 mg | Freq: Once | INTRAVENOUS | Status: AC
  Administered 2022-06-15: 4 mg via INTRAVENOUS
  Filled 2022-06-15: qty 1

## 2022-06-15 MED ORDER — TAMSULOSIN HCL 0.4 MG PO CAPS: 0.4000 mg | ORAL_CAPSULE | Freq: Every day | ORAL | 0 refills | Status: AC

## 2022-06-15 MED ORDER — ONDANSETRON HCL 4 MG/2ML IJ SOLN
4.0000 mg | Freq: Once | INTRAMUSCULAR | Status: AC
  Administered 2022-06-15: 4 mg via INTRAVENOUS
  Filled 2022-06-15: qty 2

## 2022-06-15 MED ORDER — TAMSULOSIN HCL 0.4 MG PO CAPS
0.4000 mg | ORAL_CAPSULE | Freq: Every day | ORAL | 0 refills | Status: DC
  Filled 2022-06-15: qty 30, 30d supply, fill #0

## 2022-06-15 MED ORDER — MORPHINE SULFATE (PF) 4 MG/ML IV SOLN
4.0000 mg | Freq: Once | INTRAVENOUS | Status: AC
  Administered 2022-06-15: 2 mg via INTRAVENOUS
  Filled 2022-06-15: qty 1

## 2022-06-15 NOTE — ED Triage Notes (Signed)
Patient c/o left flank pain that started around 4pm. Denies trouble urinating. Denies N/V

## 2022-06-15 NOTE — ED Provider Triage Note (Signed)
Emergency Medicine Provider Triage Evaluation Note  Alexander Mcpherson, a 36 y.o. male  was evaluated in triage.  Pt complains of acute left flank pain.  Patient reports onset of symptoms about 4 PM.  He reports some decreased urination but denies any nausea, vomiting, or gross hematuria.  Denies any history of kidney stones.  Review of Systems  Positive: Left flank pain Negative: NVD  Physical Exam  BP (!) 140/82   Pulse 63   Temp 98.1 F (36.7 C) (Oral)   Resp 20   Ht 5\' 10"  (1.778 m)   Wt 72.6 kg   SpO2 100%   BMI 22.96 kg/m  Gen:   Awake, no distress uncomfortable Resp:  Normal effort  MSK:   Moves extremities without difficulty  Other:  Soft, mild left flank tenderness  Medical Decision Making  Medically screening exam initiated at 6:11 PM.  Appropriate orders placed.  Naasir Carreira Seijo-Vila was informed that the remainder of the evaluation will be completed by another provider, this initial triage assessment does not replace that evaluation, and the importance of remaining in the ED until their evaluation is complete.  Patient to the ED with evaluation of acute left flank pain with onset about 4 PM.   Otto Herb, PA-C 06/15/22 1813

## 2022-06-15 NOTE — Discharge Instructions (Addendum)
Take ibuprofen 400mg  and tylenol 650 mg every 6 hours for pain.  For severe pain take oxycodone as prescribed.  Take FLOMAX as prescribed.   Call urologist Dr for new appointment.   See your doctor for a follow up appointment this week.   Stay well hydrated by drinking plenty of fluids.   If you have new or worsening symptoms call your doctor or come back to the emergency department.

## 2022-06-15 NOTE — ED Provider Notes (Signed)
[facility] Provider Note    Event Date/Time   First MD Initiated Contact with Patient 06/15/22 1910     (approximate)   History   Flank Pain   HPI  Alexander Mcpherson is a 36 y.o. male   Past medical history of no past medical history who presents with left flank pain acute in onset severe and intermittent radiating to his groin starting earlier today.  No dysuria, no fevers or chills.  No history of kidney stones.  No trauma.  No testicular pain scrotal changes.   History was obtained via patient and a family member who is at bedside.      Physical Exam   Triage Vital Signs: ED Triage Vitals  Enc Vitals Group     BP 06/15/22 1717 (!) 140/82     Pulse Rate 06/15/22 1717 63     Resp 06/15/22 1717 20     Temp 06/15/22 1717 98.1 F (36.7 C)     Temp Source 06/15/22 1717 Oral     SpO2 06/15/22 1717 100 %     Weight 06/15/22 1702 160 lb (72.6 kg)     Height 06/15/22 1702 5\' 10"  (1.778 m)     Head Circumference --      Peak Flow --      Pain Score 06/15/22 1702 9     Pain Loc --      Pain Edu? --      Excl. in GC? --     Most recent vital signs: Vitals:   06/15/22 1717  BP: (!) 140/82  Pulse: 63  Resp: 20  Temp: 98.1 F (36.7 C)  SpO2: 100%    General: Awake, no distress.  CV:  Good peripheral perfusion.  Resp:  Normal effort.  Abd:  No distention.  Soft and nontender.  He does have left CVA tenderness. Other:  nonToxic and comfortable appearing at this time.   ED Results / Procedures / Treatments   Labs (all labs ordered are listed, but only abnormal results are displayed) Labs Reviewed  URINALYSIS, ROUTINE W REFLEX MICROSCOPIC - Abnormal; Notable for the following components:      Result Value   Color, Urine YELLOW (*)    APPearance HAZY (*)    Hgb urine dipstick LARGE (*)    RBC / HPF >50 (*)    Bacteria, UA RARE (*)    All other components within normal limits  COMPREHENSIVE METABOLIC PANEL - Abnormal; Notable for  the following components:   Glucose, Bld 103 (*)    All other components within normal limits  CBC WITH DIFFERENTIAL/PLATELET     I reviewed labs and they are notable for urinalysis without leukocytes.06/17/22  RADIOLOGY I dependently reviewed and interpreted CT scan of the abdomen pelvis and see a hyperdensity in the distal ureter on the left side.   PROCEDURES:  Critical Care performed: No  Procedures   MEDICATIONS ORDERED IN ED: Medications  morphine (PF) 4 MG/ML injection 4 mg (2 mg Intravenous Given 06/15/22 1718)  ondansetron (ZOFRAN) injection 4 mg (4 mg Intravenous Given 06/15/22 1718)  morphine (PF) 4 MG/ML injection 4 mg (4 mg Intravenous Given 06/15/22 1817)  ketorolac (TORADOL) 30 MG/ML injection 30 mg (30 mg Intravenous Given 06/15/22 1925)  tamsulosin (FLOMAX) capsule 0.4 mg (0.4 mg Oral Given 06/15/22 1926)     IMPRESSION / MDM / ASSESSMENT AND PLAN / ED COURSE  I reviewed the triage vital signs and the nursing notes.  Differential diagnosis includes, but is not limited to, nephrolithiasis, testicular torsion, other intra-abdominal infection.    MDM: Patient with symptoms consistent with nephrolithiasis and small 3 mm kidney stone in the distal left ureter that explains his pain.  No urine infection.  Pain is well controlled in the emergency department.  Patient will follow-up with PMD and referral to urologist.  Flomax given.  Safe for discharge home with return precautions for severe pain.  Dispo: After careful consideration of this patient's presentation, medical and social risk factors, and evaluation in the emergency department I engaged in shared decision making with the patient and/or their representative to consider admission or observation and this patient was ultimately discharged because milestone likelihood to pass spontaneously is high, pain is well controlled, follow-up this week  Patient's presentation is most consistent with  acute complicated illness / injury requiring diagnostic workup.       FINAL CLINICAL IMPRESSION(S) / ED DIAGNOSES   Final diagnoses:  Nephrolithiasis     Rx / DC Orders   ED Discharge Orders          Ordered    oxyCODONE (ROXICODONE) 5 MG immediate release tablet  Every 8 hours PRN,   Status:  Discontinued        06/15/22 1945    tamsulosin (FLOMAX) 0.4 MG CAPS capsule  Daily,   Status:  Discontinued        06/15/22 1945    oxyCODONE (ROXICODONE) 5 MG immediate release tablet  Every 8 hours PRN        06/15/22 1949    tamsulosin (FLOMAX) 0.4 MG CAPS capsule  Daily        06/15/22 1949             Note:  This document was prepared using Dragon voice recognition software and may include unintentional dictation errors.    Pilar Jarvis, MD 06/15/22 [phone]

## 2023-04-17 ENCOUNTER — Encounter: Payer: Self-pay | Admitting: Family Medicine

## 2023-04-17 ENCOUNTER — Ambulatory Visit (INDEPENDENT_AMBULATORY_CARE_PROVIDER_SITE_OTHER): Payer: BC Managed Care – PPO | Admitting: Family Medicine

## 2023-04-17 VITALS — BP 114/65 | HR 49 | Ht 70.0 in | Wt 173.0 lb

## 2023-04-17 DIAGNOSIS — E559 Vitamin D deficiency, unspecified: Secondary | ICD-10-CM | POA: Diagnosis not present

## 2023-04-17 DIAGNOSIS — Z13220 Encounter for screening for lipoid disorders: Secondary | ICD-10-CM

## 2023-04-17 DIAGNOSIS — Z0000 Encounter for general adult medical examination without abnormal findings: Secondary | ICD-10-CM | POA: Diagnosis not present

## 2023-04-17 DIAGNOSIS — Z1329 Encounter for screening for other suspected endocrine disorder: Secondary | ICD-10-CM | POA: Diagnosis not present

## 2023-04-17 DIAGNOSIS — Z7689 Persons encountering health services in other specified circumstances: Secondary | ICD-10-CM

## 2023-04-17 DIAGNOSIS — Z131 Encounter for screening for diabetes mellitus: Secondary | ICD-10-CM

## 2023-04-17 NOTE — Patient Instructions (Signed)
Thank you for choosing [facility] for your Primary Care needs. I am excited for the opportunity to partner with you to meet your health care goals. It was a pleasure meeting you today!  Information on diet, exercise, and health maintenance recommendations are listed below. This is information to help you be sure you are on track for optimal health and monitoring.   Please look over this and let us know if you have any questions or if you have completed any of the health maintenance outside of  so that we can be sure your records are up to date.  ___________________________________________________________  MyChart:  For all urgent or time sensitive needs we ask that you please call the office to avoid delays. Our number is [phone]. MyChart is not constantly monitored and due to the large volume of messages a day, replies may take up to 72 business hours.  MyChart Policy: MyChart allows for you to see your visit notes, after visit summary, provider recommendations, lab and tests results, make an appointment, request refills, and contact your provider or the office for non-urgent questions or concerns. Providers are seeing patients during normal business hours and do not have built in time to review MyChart messages.  We ask that you allow a minimum of 3 business days for responses to MyChart messages. For this reason, please do not send urgent requests through MyChart. Please call the office at [phone]. New and ongoing conditions may require a visit. We have virtual and in-person visits available for your convenience.  Complex MyChart concerns may require a visit. Your provider may request you schedule a virtual or in-person visit to ensure we are providing the best care possible. MyChart messages sent after 11:00 AM on Friday will not be received by the provider until Monday morning.    Lab and Test Results: You will receive your lab and test  results on MyChart as soon as they are completed and results have been sent by the lab or testing facility. Due to this service, you will receive your results BEFORE your provider.  I review lab and test results each morning prior to seeing patients. Some results require collaboration with other providers to ensure you are receiving the most appropriate care. For this reason, we ask that you please allow a minimum of 3-5 business days from the time that ALL results have been received for your provider to receive and review lab and test results and contact you about these.  Most lab and test result comments from the provider will be sent through MyChart. Your provider may recommend changes to the plan of care, follow-up visits, repeat testing, ask questions, or request an office visit to discuss these results. You may reply directly to this message or call the office to provide information for the provider or set up an appointment. In some instances, you will be called with test results and recommendations. Please let us know if this is preferred and we will make note of this in your chart to provide this for you.    If you have not heard a response to your lab or test results in 5 business days from all results returning to MyChart, please call the office to let us know. We ask that you please avoid calling prior to this time unless there is an emergent concern. Due to high call volumes, this can delay the resulting process.  After Hours: For all non-emergency after hours needs, please   call the office at [phone] and select the option to reach the on-call  service. On-call services are shared between multiple [facility] offices and therefore it will not be possible to speak directly with your provider. On-call providers may provide medical advice and recommendations, but are unable to provide refills for maintenance medications.  For all emergency or urgent medical needs after normal business hours, we  recommend that you seek care at the closest Urgent Care or Emergency Department to ensure appropriate treatment in a timely manner.  [facility] has a 24 hour emergency room located on the ground floor for your convenience.   Urgent Concerns During the Business Day Providers are seeing patients from 8AM to 5PM with a busy schedule and are most often not able to respond to non-urgent calls until the end of the day or the next business day. If you should have URGENT concerns during the day, please call and speak to the nurse or schedule a same day appointment so that we can address your concern without delay.   Thank you, again, for choosing me as your health care partner. I appreciate your trust and look forward to learning more about you!   Libia Fazzini B. Kruti Horacek, DNP, FNP-C  ___________________________________________________________  Health Maintenance Recommendations Screening Testing Mammogram Every 1-2 years based on history and risk factors Starting at age 50 Pap Smear Ages 21-39 every 3 years Ages 30-65 every 5 years with HPV testing More frequent testing may be required based on results and history Colon Cancer Screening Every 1-10 years based on test performed, risk factors, and history Starting at age 45 Bone Density Screening Every 2-10 years based on history Starting at age 65 for women Recommendations for men differ based on medication usage, history, and risk factors AAA Screening One time ultrasound Men 65-75 years old who have ever smoked Lung Cancer Screening Low Dose Lung CT every 12 months Age 50-80 years with a 20 pack-year smoking history who still smoke or who have quit within the last 15 years  Screening Labs Routine  Labs: Complete Blood Count (CBC), Complete Metabolic Panel (CMP), Cholesterol (Lipid Panel) Every 6-12 months based on history and medications May be recommended more frequently based on current conditions or previous results Hemoglobin  A1c Lab Every 3-12 months based on history and previous results Starting at age 45 or earlier with diagnosis of diabetes, high cholesterol, BMI >26, and/or risk factors Frequent monitoring for patients with diabetes to ensure blood sugar control Thyroid Panel  Every 6 months based on history, symptoms, and risk factors May be repeated more often if on medication HIV One time testing for all patients 13 and older May be repeated more frequently for patients with increased risk factors or exposure Hepatitis C One time testing for all patients 18 and older May be repeated more frequently for patients with increased risk factors or exposure Gonorrhea, Chlamydia Every 12 months for all sexually active persons 13-24 years Additional monitoring may be recommended for those who are considered high risk or who have symptoms PSA Men 40-54 years old with risk factors Additional screening may be recommended from age 55-69 based on risk factors, symptoms, and history  Vaccine Recommendations Tetanus Booster All adults every 10 years Flu Vaccine All patients 6 months and older every year COVID Vaccine All patients 12 years and older Initial dosing with booster May recommend additional booster based on age and health history HPV Vaccine 2 doses all patients age 9-26 Dosing may be considered   for patients over 26 Shingles Vaccine (Shingrix) 2 doses all adults 50 years and older Pneumonia (Pneumovax 23) All adults 65 years and older May recommend earlier dosing based on health history Pneumonia (Prevnar 13) All adults 65 years and older Dosed 1 year after Pneumovax 23 Pneumonia (Prevnar 20) All adults 65 years and older (adults 19-64 with certain conditions or risk factors) 1 dose  For those who have not received Prevnar 13 vaccine previously   Additional Screening, Testing, and Vaccinations may be recommended on an individualized basis based on family history, health history, risk  factors, and/or exposure.  __________________________________________________________  Diet Recommendations for All Patients  I recommend that all patients maintain a diet low in saturated fats, carbohydrates, and cholesterol. While this can be challenging at first, it is not impossible and small changes can make big differences.  Things to try: Decreasing the amount of soda, sweet tea, and/or juice to one or less per day and replace with water While water is always the first choice, if you do not like water you may consider adding a water additive without sugar to improve the taste other sugar free drinks Replace potatoes with a brightly colored vegetable  Use healthy oils, such as canola oil or olive oil, instead of butter or hard margarine Limit your bread intake to two pieces or less a day Replace regular pasta with low carb pasta options Bake, broil, or grill foods instead of frying Monitor portion sizes  Eat smaller, more frequent meals throughout the day instead of large meals  An important thing to remember is, if you love foods that are not great for your health, you don't have to give them up completely. Instead, allow these foods to be a reward when you have done well. Allowing yourself to still have special treats every once in a while is a nice way to tell yourself thank you for working hard to keep yourself healthy.   Also remember that every day is a new day. If you have a bad day and "fall off the wagon", you can still climb right back up and keep moving along on your journey!  We have resources available to help you!  Some websites that may be helpful include: www.MyPlate.gov  Www.VeryWellFit.com _____________________________________________________________  Activity Recommendations for All Patients  I recommend that all adults get at least 20 minutes of moderate physical activity that elevates your heart rate at least 5 days out of the week.  Some examples  include: Walking or jogging at a pace that allows you to carry on a conversation Cycling (stationary bike or outdoors) Water aerobics Yoga Weight lifting Dancing If physical limitations prevent you from putting stress on your joints, exercise in a pool or seated in a chair are excellent options.  Do determine your MAXIMUM heart rate for activity: 220 - YOUR AGE = MAX Heart Rate   Remember! Do not push yourself too hard.  Start slowly and build up your pace, speed, weight, time in exercise, etc.  Allow your body to rest between exercise and get good sleep. You will need more water than normal when you are exerting yourself. Do not wait until you are thirsty to drink. Drink with a purpose of getting in at least 8, 8 ounce glasses of water a day plus more depending on how much you exercise and sweat.    If you begin to develop dizziness, chest pain, abdominal pain, jaw pain, shortness of breath, headache, vision changes, lightheadedness, or other concerning symptoms,   stop the activity and allow your body to rest. If your symptoms are severe, seek emergency evaluation immediately. If your symptoms are concerning, but not severe, please let us know so that we can recommend further evaluation.     

## 2023-04-17 NOTE — Progress Notes (Signed)
Complete physical exam  Patient: Alexander Mcpherson   DOB: 1985/10/30   37 y.o. Male  MRN: [id number]  Subjective:    Chief Complaint  Patient presents with   Annual Exam    Alexander Mcpherson is a 37 y.o. male who presents today to establish care and for a complete physical exam. He reports consuming a general diet. Home exercise routine includes weights 3-4x/week, cardio, basketball. He generally feels well. He reports sleeping well. He does not have additional problems to discuss today.    Currently lives with: wife, kids (4 and 65-months old girl and boy) Acute concerns or interim problems since last visit: no  Vision concerns: no concerns Dental concerns: no concerns STD concerns: no  Patient denies ETOH use. Patient denies nicotine use. Patient denies illegal substance use.         Most recent fall risk assessment:    04/17/2023   11:00 AM  Fall Risk   Falls in the past year? 0  Number falls in past yr: 0  Injury with Fall? 0  Risk for fall due to : No Fall Risks  Follow up Falls evaluation completed     Most recent depression screenings:    04/17/2023   11:00 AM 08/16/2019    8:51 AM  PHQ 2/9 Scores  PHQ - 2 Score 0 0  PHQ- 9 Score 1 0         Patient Active Problem List   Diagnosis Date Noted   Vitamin D deficiency 10/15/2021   Other fatigue 10/15/2021   Annual physical exam 01/11/2016   Encounter to establish care with new doctor 11/29/2015   Past Medical History:  Diagnosis Date   Allergic to cats    Allergy    Asthma    Environmental and seasonal allergies    Family History  Problem Relation Age of Onset   Diabetes type II Mother    Healthy Father    Diabetes type II Maternal Grandmother    Heart attack Maternal Grandmother 79   Allergies  Allergen Reactions   Cat Hair Extract    Other     Pine nuts      Patient Care Team: Clayborne Dana, NP as PCP - General (Family Medicine) Marcellus Scott (Optometry)    Outpatient Medications Prior to Visit  Medication Sig   Acetylcysteine (NAC PO) Take 1 tablet by mouth in the morning, at noon, and at bedtime.   Magnesium Oxide (MAG-CAPS PO) Take 1 tablet by mouth daily.   Multiple Vitamin (MULTIVITAMIN) capsule Take 1 capsule by mouth daily.   [DISCONTINUED] meloxicam (MOBIC) 15 MG tablet Take 15 mg by mouth daily.   [DISCONTINUED] zinc gluconate 50 MG tablet Take 50 mg by mouth daily.   [DISCONTINUED] Albuterol Sulfate (PROAIR RESPICLICK) 108 (90 Base) MCG/ACT AEPB Inhale 1-2 puffs into the lungs every 4 (four) hours as needed (Wheezing/coughing). (Patient not taking: Reported on 10/15/2021)   [DISCONTINUED] cholecalciferol (VITAMIN D3) 25 MCG (1000 UT) tablet Take 1,000 Units by mouth daily.   [DISCONTINUED] oxyCODONE (ROXICODONE) 5 MG immediate release tablet Take 1 tablet (5 mg total) by mouth every 8 (eight) hours as needed for up to 8 doses.   [DISCONTINUED] Vitamin D, Ergocalciferol, (DRISDOL) 50000 units CAPS capsule Take 1 capsule (50,000 Units total) by mouth once a week. For 12 weeks (Patient not taking: Reported on 08/16/2019)   No facility-administered medications prior to visit.    ROS All review of systems negative except what is  listed in the HPI        Objective:     BP 114/65   Pulse (!) 49   Ht 5\' 10"  (1.778 m)   Wt 173 lb (78.5 kg)   SpO2 100%   BMI 24.82 kg/m    Physical Exam Vitals reviewed.  Constitutional:      General: He is not in acute distress.    Appearance: Normal appearance. He is not ill-appearing.  HENT:     Head: Normocephalic and atraumatic.     Right Ear: Tympanic membrane normal.     Left Ear: Tympanic membrane normal.     Nose: Nose normal.     Mouth/Throat:     Mouth: Mucous membranes are moist.     Pharynx: Oropharynx is clear.  Eyes:     Extraocular Movements: Extraocular movements intact.     Conjunctiva/sclera: Conjunctivae normal.     Pupils: Pupils are equal, round, and reactive to  light.  Neck:     Vascular: No carotid bruit.  Cardiovascular:     Rate and Rhythm: Normal rate and regular rhythm.     Pulses: Normal pulses.     Heart sounds: Normal heart sounds.  Pulmonary:     Effort: Pulmonary effort is normal.     Breath sounds: Normal breath sounds.  Abdominal:     General: Abdomen is flat. Bowel sounds are normal. There is no distension.     Palpations: Abdomen is soft. There is no mass.     Tenderness: There is no abdominal tenderness. There is no right CVA tenderness, left CVA tenderness, guarding or rebound.  Genitourinary:    Comments: Deferred exam Musculoskeletal:        General: Normal range of motion.     Cervical back: Normal range of motion and neck supple. No tenderness.     Right lower leg: No edema.     Left lower leg: No edema.  Lymphadenopathy:     Cervical: No cervical adenopathy.  Skin:    General: Skin is warm and dry.     Capillary Refill: Capillary refill takes less than 2 seconds.  Neurological:     General: No focal deficit present.     Mental Status: He is alert and oriented to person, place, and time. Mental status is at baseline.  Psychiatric:        Mood and Affect: Mood normal.        Behavior: Behavior normal.        Thought Content: Thought content normal.        Judgment: Judgment normal.      No results found for any visits on 04/17/23.     Assessment & Plan:    Routine Health Maintenance and Physical Exam Discussed health promotion and safety including diet and exercise recommendations, dental health, and injury prevention. Tobacco cessation if applicable. Seat belts, sunscreen, smoke detectors, etc.    Immunization History  Administered Date(s) Administered   Influenza-Unspecified 07/21/2015, 07/31/2017, 08/11/2018, 07/22/2019   Tdap 10/20/2012    Health Maintenance  Topic Date Due   DTaP/Tdap/Td (2 - Td or Tdap) 10/20/2022   COVID-19 Vaccine (1) 04/16/2024 (Originally 01/30/1987)   INFLUENZA VACCINE   05/21/2023   Hepatitis C Screening  Completed   HIV Screening  Completed   HPV VACCINES  Aged Out        Problem List Items Addressed This Visit     Encounter to establish care with new doctor - Primary   Vitamin  D deficiency   Relevant Orders   Vitamin D (25 hydroxy)   Other Visit Diagnoses     Lipid screening       Relevant Orders   Lipid panel   Thyroid disorder screen       Relevant Orders   TSH   Diabetes mellitus screening       Relevant Orders   Comprehensive metabolic panel   Preventative health care       Relevant Orders   CBC with Differential/Platelet   Comprehensive metabolic panel   Lipid panel   TSH   Vitamin D (25 hydroxy)      Return in about 1 year (around 04/16/2024) for physical.     Clayborne Dana, NP

## 2023-04-21 LAB — COMPREHENSIVE METABOLIC PANEL
ALT: 15 IU/L (ref 0–44)
AST: 22 IU/L (ref 0–40)
Albumin: 4.6 g/dL (ref 4.1–5.1)
Alkaline Phosphatase: 54 IU/L (ref 44–121)
BUN/Creatinine Ratio: 14 (ref 9–20)
BUN: 16 mg/dL (ref 6–20)
Bilirubin Total: 0.6 mg/dL (ref 0.0–1.2)
CO2: 25 mmol/L (ref 20–29)
Calcium: 9.9 mg/dL (ref 8.7–10.2)
Chloride: 104 mmol/L (ref 96–106)
Creatinine, Ser: 1.17 mg/dL (ref 0.76–1.27)
Globulin, Total: 2.2 g/dL (ref 1.5–4.5)
Glucose: 94 mg/dL (ref 70–99)
Potassium: 5 mmol/L (ref 3.5–5.2)
Sodium: 139 mmol/L (ref 134–144)
Total Protein: 6.8 g/dL (ref 6.0–8.5)
eGFR: 83 mL/min/{1.73_m2} (ref >59)

## 2023-04-21 LAB — CBC WITH DIFFERENTIAL/PLATELET
Basophils Absolute: 0 10*3/uL (ref 0.0–0.2)
Basos: 1 %
EOS (ABSOLUTE): 0.1 10*3/uL (ref 0.0–0.4)
Eos: 2 %
Hematocrit: 45 % (ref 37.5–51.0)
Hemoglobin: 15 g/dL (ref 13.0–17.7)
Immature Grans (Abs): 0 10*3/uL (ref 0.0–0.1)
Immature Granulocytes: 0 %
Lymphocytes Absolute: 1.7 10*3/uL (ref 0.7–3.1)
Lymphs: 34 %
MCH: 28.8 pg (ref 26.6–33.0)
MCHC: 33.3 g/dL (ref 31.5–35.7)
MCV: 86 fL (ref 79–97)
Monocytes Absolute: 0.4 10*3/uL (ref 0.1–0.9)
Monocytes: 7 %
Neutrophils Absolute: 2.8 10*3/uL (ref 1.4–7.0)
Neutrophils: 56 %
Platelets: 194 10*3/uL (ref 150–450)
RBC: 5.21 x10E6/uL (ref 4.14–5.80)
RDW: 13.1 % (ref 11.6–15.4)
WBC: 5.1 10*3/uL (ref 3.4–10.8)

## 2023-04-21 LAB — LIPID PANEL
Chol/HDL Ratio: 2.7 ratio (ref 0.0–5.0)
Cholesterol, Total: 140 mg/dL (ref 100–199)
HDL: 51 mg/dL (ref >39)
LDL Chol Calc (NIH): 78 mg/dL (ref 0–99)
Triglycerides: 50 mg/dL (ref 0–149)
VLDL Cholesterol Cal: 11 mg/dL (ref 5–40)

## 2023-04-21 LAB — TSH: TSH: 1.38 u[IU]/mL (ref 0.450–4.500)

## 2023-04-21 LAB — VITAMIN D 25 HYDROXY (VIT D DEFICIENCY, FRACTURES): Vit D, 25-Hydroxy: 29.7 ng/mL — ABNORMAL LOW (ref 30.0–100.0)

## 2024-04-18 ENCOUNTER — Encounter: Payer: BC Managed Care – PPO | Admitting: Family Medicine
